# Patient Record
Sex: Female | Born: 1935 | Race: White | Hispanic: No | Smoking: Never smoker
Health system: Southern US, Community
[De-identification: ages and names within clinical notes are randomized; demographics above are authoritative.]

## PROBLEM LIST (undated history)

## (undated) DIAGNOSIS — E119 Type 2 diabetes mellitus without complications: Secondary | ICD-10-CM

## (undated) DIAGNOSIS — I639 Cerebral infarction, unspecified: Secondary | ICD-10-CM

## (undated) DIAGNOSIS — S72142B Displaced intertrochanteric fracture of left femur, initial encounter for open fracture type I or II: Secondary | ICD-10-CM

## (undated) DIAGNOSIS — F419 Anxiety disorder, unspecified: Secondary | ICD-10-CM

## (undated) HISTORY — PX: HIP SURGERY: SHX245

## (undated) HISTORY — DX: Cerebral infarction, unspecified: I63.9

## (undated) HISTORY — PX: ABDOMINAL HYSTERECTOMY: SHX81

---

## 2013-06-01 ENCOUNTER — Ambulatory Visit: Payer: Self-pay | Admitting: Ophthalmology

## 2013-08-10 ENCOUNTER — Ambulatory Visit: Payer: Self-pay | Admitting: Ophthalmology

## 2017-04-02 ENCOUNTER — Encounter: Payer: Self-pay | Admitting: Emergency Medicine

## 2017-04-02 ENCOUNTER — Emergency Department: Payer: Medicare Other

## 2017-04-02 ENCOUNTER — Emergency Department
Admission: EM | Admit: 2017-04-02 | Discharge: 2017-04-02 | Disposition: A | Discharge status: Skilled Nursing Facility | Payer: Medicare Other | Attending: Emergency Medicine | Admitting: Emergency Medicine

## 2017-04-02 DIAGNOSIS — I10 Essential (primary) hypertension: Secondary | ICD-10-CM | POA: Insufficient documentation

## 2017-04-02 DIAGNOSIS — E119 Type 2 diabetes mellitus without complications: Secondary | ICD-10-CM | POA: Diagnosis not present

## 2017-04-02 DIAGNOSIS — L03115 Cellulitis of right lower limb: Secondary | ICD-10-CM | POA: Insufficient documentation

## 2017-04-02 DIAGNOSIS — R2243 Localized swelling, mass and lump, lower limb, bilateral: Secondary | ICD-10-CM | POA: Diagnosis present

## 2017-04-02 HISTORY — DX: Anxiety disorder, unspecified: F41.9

## 2017-04-02 HISTORY — DX: Type 2 diabetes mellitus without complications: E11.9

## 2017-04-02 HISTORY — DX: Cerebral infarction, unspecified: I63.9

## 2017-04-02 HISTORY — DX: Displaced intertrochanteric fracture of left femur, initial encounter for open fracture type I or II: S72.142B

## 2017-04-02 LAB — BASIC METABOLIC PANEL
Anion gap: 10 (ref 5–15)
BUN: 18 mg/dL (ref 6–20)
CHLORIDE: 105 mmol/L (ref 101–111)
CO2: 25 mmol/L (ref 22–32)
CREATININE: 0.7 mg/dL (ref 0.44–1.00)
Calcium: 8.5 mg/dL — ABNORMAL LOW (ref 8.9–10.3)
GFR calc Af Amer: >60 mL/min (ref >60)
GFR calc non Af Amer: >60 mL/min (ref >60)
GLUCOSE: 138 mg/dL — AB (ref 65–99)
POTASSIUM: 3.7 mmol/L (ref 3.5–5.1)
Sodium: 140 mmol/L (ref 135–145)

## 2017-04-02 LAB — CBC WITH DIFFERENTIAL/PLATELET
Basophils Absolute: 0.2 10*3/uL — ABNORMAL HIGH (ref 0–0.1)
Basophils Relative: 4 %
EOS PCT: 6 %
Eosinophils Absolute: 0.4 10*3/uL (ref 0–0.7)
HCT: 31.1 % — ABNORMAL LOW (ref 35.0–47.0)
HEMOGLOBIN: 10.1 g/dL — AB (ref 12.0–16.0)
LYMPHS ABS: 1.1 10*3/uL (ref 1.0–3.6)
LYMPHS PCT: 16 %
MCH: 27.1 pg (ref 26.0–34.0)
MCHC: 32.4 g/dL (ref 32.0–36.0)
MCV: 83.5 fL (ref 80.0–100.0)
MONOS PCT: 7 %
Monocytes Absolute: 0.4 10*3/uL (ref 0.2–0.9)
Neutro Abs: 4.4 10*3/uL (ref 1.4–6.5)
Neutrophils Relative %: 67 %
PLATELETS: 317 10*3/uL (ref 150–440)
RBC: 3.72 MIL/uL — AB (ref 3.80–5.20)
RDW: 17 % — ABNORMAL HIGH (ref 11.5–14.5)
WBC: 6.6 10*3/uL (ref 3.6–11.0)

## 2017-04-02 LAB — LACTIC ACID, PLASMA: Lactic Acid, Venous: 0.8 mmol/L (ref 0.5–1.9)

## 2017-04-02 MED ORDER — CLINDAMYCIN PHOSPHATE 600 MG/50ML IV SOLN
600.0000 mg | Freq: Once | INTRAVENOUS | Status: AC
  Administered 2017-04-02: 600 mg via INTRAVENOUS
  Filled 2017-04-02: qty 50

## 2017-04-02 MED ORDER — CLINDAMYCIN HCL 300 MG PO CAPS: 300.0000 mg | ORAL_CAPSULE | Freq: Three times a day (TID) | ORAL | 0 refills | Status: AC

## 2017-04-02 NOTE — ED Notes (Signed)
Pt daughter signed paper copy of discharge instructions

## 2017-04-02 NOTE — ED Provider Notes (Signed)
[facility] Emergency Department Provider Note  ____________________________________________  Time seen: Approximately 3:14 PM  I have reviewed the triage vital signs and the nursing notes.   HISTORY  Chief Complaint Leg Swelling   HPI Candice Kelly is a 83 y.o. female with h/o recent CVA on ASA on 02/2017 and L hip replacement on 9/18, DM, HTN, HLD who presents for evaluation of bilateral leg swelling and cyanosis. Per daughter patient has had progressively worsening swelling of her legs for the last few days R>L. Daughter has also noticed that patient's toes will turn blue when patient stands for prolonged periods, the cyanosis resolves when the leg is elevated. Patient denies no leg pain. No personal or fh of DVT but patient has had several hospitalizations in the last few months. No CP or SOB, no fever, chills, nausea, or vomiting.   Past Medical History:  Diagnosis Date  . Anxiety   . Cerebral infarct (HCC)   . Diabetes mellitus without complication (HCC)   . Displaced intertroch fx left femur, init for opn fx type I/2 (HCC)     There are no active problems to display for this patient.   Past Surgical History:  Procedure Laterality Date  . ABDOMINAL HYSTERECTOMY    . HIP SURGERY      Prior to Admission medications   Medication Sig Start Date End Date Taking? Authorizing Provider  clindamycin (CLEOCIN) 300 MG capsule Take 1 capsule (300 mg total) by mouth 3 (three) times daily for 10 days. 04/02/17 04/12/17  Nita SickleVeronese, [place], MD    Allergies Penicillins  History reviewed. No pertinent family history.  Social History Social History   Tobacco Use  . Smoking status: Never Smoker  . Smokeless tobacco: Never Used  Substance Use Topics  . Alcohol use: No    Frequency: Never  . Drug use: No    Review of Systems  Constitutional: Negative for fever. Eyes: Negative for visual changes. ENT: Negative for sore throat. Neck: No neck pain    Cardiovascular: Negative for chest pain. Respiratory: Negative for shortness of breath. Gastrointestinal: Negative for abdominal pain, vomiting or diarrhea. Genitourinary: Negative for dysuria. Musculoskeletal: Negative for back pain. + b/l leg swelling and bluish discoloration Skin: Negative for rash. Neurological: Negative for headaches, weakness or numbness. Psych: No SI or HI  ____________________________________________   PHYSICAL EXAM:  VITAL SIGNS: ED Triage Vitals  Enc Vitals Group     BP 04/02/17 1500 (!) 166/65     Pulse Rate 04/02/17 1500 80     Resp 04/02/17 1500 16     Temp 04/02/17 1500 97.6 F (36.4 C)     Temp Source 04/02/17 1500 Oral     SpO2 04/02/17 1500 96 %     Weight 04/02/17 1438 208 lb 11.2 oz (94.7 kg)     Height 04/02/17 1438 5\' 2"  (1.575 m)     Head Circumference --      Peak Flow --      Pain Score --      Pain Loc --      Pain Edu? --      Excl. in GC? --     Constitutional: Alert and oriented. Well appearing and in no apparent distress. HEENT:      Head: Normocephalic and atraumatic.         Eyes: Conjunctivae are normal. Sclera is non-icteric.       Mouth/Throat: Mucous membranes are moist.       Neck:  Supple with no signs of meningismus. Cardiovascular: Regular rate and rhythm. No murmurs, gallops, or rubs. 2+ symmetrical distal pulses are present in all extremities. No JVD. Respiratory: Normal respiratory effort. Lungs are clear to auscultation bilaterally. No wheezes, crackles, or rhonchi.  Gastrointestinal: Soft, non tender, and non distended with positive bowel sounds. No rebound or guarding. Musculoskeletal: Pitting edema on b/l LE R>L, RLE is warm to touch and erythematous, LLE has no erythema and normal temperature. Palpable DP and PT pulses on the R, dopplerable DP and PT pulses on the L, no cyanosis.  Neurologic: Normal speech and language. Face is symmetric. Moving all extremities. No gross focal neurologic deficits are  appreciated. Skin: Skin is warm, dry and intact. No rash noted. Psychiatric: Mood and affect are normal. Speech and behavior are normal.  ____________________________________________   LABS (all labs ordered are listed, but only abnormal results are displayed)  Labs Reviewed  CBC WITH DIFFERENTIAL/PLATELET - Abnormal; Notable for the following components:      Result Value   RBC 3.72 (*)    Hemoglobin 10.1 (*)    HCT 31.1 (*)    RDW 17.0 (*)    Basophils Absolute 0.2 (*)    All other components within normal limits  BASIC METABOLIC PANEL - Abnormal; Notable for the following components:   Glucose, Bld 138 (*)    Calcium 8.5 (*)    All other components within normal limits  CULTURE, BLOOD (ROUTINE X 2)  CULTURE, BLOOD (ROUTINE X 2)  LACTIC ACID, PLASMA   ____________________________________________  EKG  none  ____________________________________________  RADIOLOGY  Doppler: negative for DVT  ____________________________________________   PROCEDURES  Procedure(s) performed: None Procedures Critical Care performed:  None ____________________________________________   INITIAL IMPRESSION / ASSESSMENT AND PLAN / ED COURSE  83 y.o. female with h/o recent CVA on ASA on 02/2017 and L hip replacement on 9/18, DM, HTN, HLD who presents for evaluation of bilateral leg swelling and cyanosis for the last few days. Patient has cellulitis on the RLE, no fever or tachycardia. Will check labs to rule out systemic infection or DKA. Will get doppler of b/l LE to eval for DVT due to several recent hospitalizations. Patient has no CP/ SOB. Patient most likely has component of PVD based on age and comorbidities however there is no evidence of ischemic foot at this time and the fact that cyanosis resolves with elevation of the limb and that patient has no fever, would argue against PVD as the possible etiology for the symptoms in the LLE. If no evidence of DVT, systemic infection, or DKA  plan for one dose of IV abx and dc home with referral to see vascular surgery as outpatient.     _________________________ 5:24 PM on 04/02/2017 -----------------------------------------  Both extremities remain well perfused with no evidence of ischemia. Doppler negative for DVT. Patient given one dose of IV clindamycin and will be dc back to rehab on clinda TID x 10 days. Discussed return precautions with patient and daughter for signs of worsening infection. Labs with normal WBC and no evidence of DKA. Patient remains pain free. Also recommended return to the ER if patient has pain or if cyanosis recurs otherwise referred patient to vascular surgery for close f/u as outpatient.    As part of my medical decision making, I reviewed the following data within the electronic MEDICAL RECORD NUMBER History obtained from family, Nursing notes reviewed and incorporated, Labs reviewed , Old chart reviewed, Radiograph reviewed , Notes from prior  ED visits and [place] Controlled Substance Database    Pertinent labs & imaging results that were available during my care of the patient were reviewed by me and considered in my medical decision making (see chart for details).    ____________________________________________   FINAL CLINICAL IMPRESSION(S) / ED DIAGNOSES  Final diagnoses:  Cellulitis of right lower extremity      NEW MEDICATIONS STARTED DURING THIS VISIT:  ED Discharge Orders        Ordered    clindamycin (CLEOCIN) 300 MG capsule  3 times daily     04/02/17 1721       Note:  This document was prepared using Dragon voice recognition software and may include unintentional dictation errors.    Nita SickleVeronese, [place], MD 04/02/17 [phone]

## 2017-04-02 NOTE — ED Notes (Signed)
MD Veronese at bedside  

## 2017-04-02 NOTE — ED Triage Notes (Signed)
Pt to ED by EMS from [facility] with c/o of bilateral LE swelling. Per the Pt's daughter the residents legs have been swelling and turning "blue". Pt suffered a stroke on Thanksgiving which has impaired her speech.

## 2017-04-02 NOTE — ED Notes (Signed)
[facility]  CALLED  TO  TRANSPORT  PT ?

## 2017-04-02 NOTE — ED Notes (Signed)
Secretary notified to call ems to transport [facility]

## 2017-04-02 NOTE — Discharge Instructions (Signed)

## 2017-04-07 LAB — CULTURE, BLOOD (ROUTINE X 2)
CULTURE: NO GROWTH
Culture: NO GROWTH
SPECIAL REQUESTS: ADEQUATE
Special Requests: ADEQUATE

## 2017-04-15 ENCOUNTER — Encounter (INDEPENDENT_AMBULATORY_CARE_PROVIDER_SITE_OTHER): Payer: Self-pay | Admitting: Vascular Surgery

## 2017-04-15 ENCOUNTER — Ambulatory Visit (INDEPENDENT_AMBULATORY_CARE_PROVIDER_SITE_OTHER): Payer: Medicare Other | Admitting: Vascular Surgery

## 2017-04-15 VITALS — BP 159/72 | HR 72 | Resp 14 | Ht 60.0 in | Wt 210.0 lb

## 2017-04-15 DIAGNOSIS — E119 Type 2 diabetes mellitus without complications: Secondary | ICD-10-CM | POA: Diagnosis not present

## 2017-04-15 DIAGNOSIS — I639 Cerebral infarction, unspecified: Secondary | ICD-10-CM

## 2017-04-15 DIAGNOSIS — M7989 Other specified soft tissue disorders: Secondary | ICD-10-CM | POA: Insufficient documentation

## 2017-04-15 NOTE — Assessment & Plan Note (Signed)
I have had a long discussion with the patient regarding swelling and why it  causes symptoms.  Patient will begin wearing graduated compression stockings class 1 (20-30 mmHg) on a daily basis a prescription was given. The patient will  beginning wearing the stockings first thing in the morning and removing them in the evening. The patient is instructed specifically not to sleep in the stockings.   In addition, behavioral modification will be initiated.  This will include frequent elevation, use of over the counter pain medications and exercise such as walking.  I have reviewed systemic causes for chronic edema such as liver, kidney and cardiac etiologies.  The patient denies problems with these organ systems.    Consideration for a lymph pump will also be made based upon the effectiveness of conservative therapy.  This would help to improve the edema control and prevent sequela such as ulcers and infections    The patient will follow-up with me in 3 months to assess how she is doing with compression and elevation.  We will try to avoid any invasive procedures on her if possible as long as her symptoms remain reasonably stable.

## 2017-04-15 NOTE — Assessment & Plan Note (Signed)
blood glucose control important in reducing the progression of atherosclerotic disease. Also, involved in wound healing. On appropriate medications.  

## 2017-04-15 NOTE — Patient Instructions (Signed)
Edema Edema is an abnormal buildup of fluids in your bodytissues. Edema is somewhatdependent on gravity to pull the fluid to the lowest place in your body. That makes the condition more common in the legs and thighs (lower extremities). Painless swelling of the feet and ankles is common and becomes more likely as you get older. It is also common in looser tissues, like around your eyes. When the affected area is squeezed, the fluid may move out of that spot and leave a dent for a few moments. This dent is called pitting. What are the causes? There are many possible causes of edema. Eating too much salt and being on your feet or sitting for a long time can cause edema in your legs and ankles. Hot weather may make edema worse. Common medical causes of edema include:  Heart failure.  Liver disease.  Kidney disease.  Weak blood vessels in your legs.  Cancer.  An injury.  Pregnancy.  Some medications.  Obesity.  What are the signs or symptoms? Edema is usually painless.Your skin may look swollen or shiny. How is this diagnosed? Your health care provider may be able to diagnose edema by asking about your medical history and doing a physical exam. You may need to have tests such as X-rays, an electrocardiogram, or blood tests to check for medical conditions that may cause edema. How is this treated? Edema treatment depends on the cause. If you have heart, liver, or kidney disease, you need the treatment appropriate for these conditions. General treatment may include:  Elevation of the affected body part above the level of your heart.  Compression of the affected body part. Pressure from elastic bandages or support stockings squeezes the tissues and forces fluid back into the blood vessels. This keeps fluid from entering the tissues.  Restriction of fluid and salt intake.  Use of a water pill (diuretic). These medications are appropriate only for some types of edema. They pull fluid  out of your body and make you urinate more often. This gets rid of fluid and reduces swelling, but diuretics can have side effects. Only use diuretics as directed by your health care provider.  Follow these instructions at home:  Keep the affected body part above the level of your heart when you are lying down.  Do not sit still or stand for prolonged periods.  Do not put anything directly under your knees when lying down.  Do not wear constricting clothing or garters on your upper legs.  Exercise your legs to work the fluid back into your blood vessels. This may help the swelling go down.  Wear elastic bandages or support stockings to reduce ankle swelling as directed by your health care provider.  Eat a low-salt diet to reduce fluid if your health care provider recommends it.  Only take medicines as directed by your health care provider. Contact a health care provider if:  Your edema is not responding to treatment.  You have heart, liver, or kidney disease and notice symptoms of edema.  You have edema in your legs that does not improve after elevating them.  You have sudden and unexplained weight gain. Get help right away if:  You develop shortness of breath or chest pain.  You cannot breathe when you lie down.  You develop pain, redness, or warmth in the swollen areas.  You have heart, liver, or kidney disease and suddenly get edema.  You have a fever and your symptoms suddenly get worse. This information is   not intended to replace advice given to you by your health care provider. Make sure you discuss any questions you have with your health care provider. Document Released: 04/15/2005 Document Revised: 09/21/2015 Document Reviewed: 02/05/2013 Elsevier Interactive Patient Education  2017 [employer]  

## 2017-04-15 NOTE — Assessment & Plan Note (Signed)
Mental status is poor and she is not a good historian.  Lifestyle modifications will be unreliable

## 2017-04-15 NOTE — Progress Notes (Signed)
Patient ID: Candice Kelly, female   DOB: [birth year], 83 y.o.   MRN: [id number]  Chief Complaint  Patient presents with  . New Patient (Initial Visit)    [facility] follow up one week    HPI Candice Kelly is a 83 y.o. female.  I am asked to see the patient by Dr. Alfred Levins in the [facility] ER for evaluation of recent lower extremity cellulitis and.  The patient provides little history and she is with a caretaker from her nursing facility today.  Her swelling seems reasonably mild today but apparently it was worse a couple of weeks ago.  She reports no pain today.  She denies any fever or chills.  She sits with her legs dependent much of the day in a wheelchair and really does not walk much.  No open wounds or ulcers at this point.  The cellulitis has improved with antibiotics.  There is no clear inciting event or causative factor that started the symptoms.  She had a negative DVT study at the hospital I have reviewed   Past Medical History:  Diagnosis Date  . Anxiety   . Cerebral infarct ([place])   . Diabetes mellitus without complication ([place])   . Displaced intertroch fx left femur, init for opn fx type I/2 ([place])   . Stroke (cerebrum) [facility])     Past Surgical History:  Procedure Laterality Date  . ABDOMINAL HYSTERECTOMY    . HIP SURGERY      Family History No bleeding disorders, clotting disorders, autoimmune diseases, or aneurysms  Social History Social History   Tobacco Use  . Smoking status: Never Smoker  . Smokeless tobacco: Never Used  Substance Use Topics  . Alcohol use: No    Frequency: Never  . Drug use: No  Lives in a facility  Allergies  Allergen Reactions  . Penicillins   . Potassium     Other reaction(s): Other (See Comments)  . Penicillin V Potassium Rash    As an adult    Current Outpatient Medications  Medication Sig Dispense Refill  . ALPRAZolam (XANAX) 0.25 MG tablet Take by mouth.    Marland Kitchen aspirin 81 MG chewable tablet Chew by mouth.    . calcium-vitamin D (OSCAL  WITH D) 500-200 MG-UNIT TABS tablet Take by mouth.    . ERGOCALCIFEROL PO Take by mouth.    Marland Kitchen glimepiride (AMARYL) 2 MG tablet TAKE 1 TABLET BY MOUTH ONCE DAILY    . Melatonin 3 MG TABS Take by mouth.    . nystatin (MYCOSTATIN/NYSTOP) powder Apply topically.    Marland Kitchen oxyCODONE (OXY IR/ROXICODONE) 5 MG immediate release tablet Take by mouth.    . pantoprazole (PROTONIX) 40 MG tablet Take by mouth.    . simvastatin (ZOCOR) 20 MG tablet Take by mouth.     No current facility-administered medications for this visit.       REVIEW OF SYSTEMS (Negative unless checked)  Constitutional: [] Weight loss  [] Fever  [] Chills Cardiac: [] Chest pain   [] Chest pressure   [] Palpitations   [] Shortness of breath when laying flat   [] Shortness of breath at rest   [] Shortness of breath with exertion. Vascular:  [] Pain in legs with walking   [] Pain in legs at rest   [] Pain in legs when laying flat   [] Claudication   [] Pain in feet when walking  [] Pain in feet at rest  [] Pain in feet when laying flat   [] History of DVT   [] Phlebitis   [x] Swelling in legs   []   Varicose veins   [] Non-healing ulcers Pulmonary:   [] Uses home oxygen   [] Productive cough   [] Hemoptysis   [] Wheeze  [] COPD   [] Asthma Neurologic:  [] Dizziness  [] Blackouts   [] Seizures   [x] History of stroke   [] History of TIA  [] Aphasia   [] Temporary blindness   [] Dysphagia   [] Weakness or numbness in arms   [] Weakness or numbness in legs Musculoskeletal:  [x] Arthritis   [] Joint swelling   [] Joint pain   [] Low back pain Hematologic:  [] Easy bruising  [] Easy bleeding   [] Hypercoagulable state   [] Anemic  [] Hepatitis Gastrointestinal:  [] Blood in stool   [] Vomiting blood  [] Gastroesophageal reflux/heartburn   [] Abdominal pain Genitourinary:  [] Chronic kidney disease   [] Difficult urination  [] Frequent urination  [] Burning with urination   [] Hematuria Skin:  [] Rashes   [] Ulcers   [] Wounds Psychological:  [] History of anxiety   []  History of major  depression.    Physical Exam BP (!) 159/72 (BP Location: Left Arm, Patient Position: Sitting)   Pulse 72   Resp 14   Ht 5' (1.524 m)   Wt 95.3 kg (210 lb)   BMI 41.01 kg/m  Gen:  WD/WN, NAD.  Appears younger than stated age Head: /AT, No temporalis wasting. Ear/Nose/Throat: Hearing grossly intact, nares w/o erythema or drainage, oropharynx w/o Erythema/Exudate Eyes: Conjunctiva clear, sclera non-icteric  Neck: trachea midline.  No JVD.  Pulmonary:  Good air movement, respirations not labored, no use of accessory muscles Cardiac: Irregular Vascular:  Vessel Right Left  Radial Palpable Palpable                          PT  not palpable  not palpable  DP  1+ palpable  1+ palpable   Gastrointestinal: soft, non-tender/non-distended.  Musculoskeletal: M/S 5/5 throughout.  Extremities without ischemic changes.  No deformity or atrophy.  1+ bilateral lower extremity edema. Neurologic: Sensation grossly intact in extremities.  Symmetrical.  Speech is limited. Motor exam as listed above. Psychiatric: Judgment and insight are poor.  Patient is a poor historian. Dermatologic: No rashes or ulcers noted.  No cellulitis or open wounds.  Mild stasis dermatitis changes are present in the lower leg    Radiology US Venous Img Lower Bilateral  Result Date: 04/02/2017 CLINICAL DATA:  Bilateral lower extremity swelling EXAM: BILATERAL LOWER EXTREMITY VENOUS DOPPLER ULTRASOUND TECHNIQUE: Gray-scale sonography with graded compression, as well as color Doppler and duplex ultrasound were performed to evaluate the lower extremity deep venous systems from the level of the common femoral vein and including the common femoral, femoral, profunda femoral, popliteal and calf veins including the posterior tibial, peroneal and gastrocnemius veins when visible. The superficial great saphenous vein was also interrogated. Spectral Doppler was utilized to evaluate flow at rest and with distal augmentation  maneuvers in the common femoral, femoral and popliteal veins. COMPARISON:  None. FINDINGS: RIGHT LOWER EXTREMITY Common Femoral Vein: No evidence of thrombus. Normal compressibility, respiratory phasicity and response to augmentation. Saphenofemoral Junction: No evidence of thrombus. Normal compressibility and flow on color Doppler imaging. Profunda Femoral Vein: No evidence of thrombus. Normal compressibility and flow on color Doppler imaging. Femoral Vein: No evidence of thrombus. Normal compressibility, respiratory phasicity and response to augmentation. Popliteal Vein: No evidence of thrombus. Normal compressibility, respiratory phasicity and response to augmentation. Calf Veins: No evidence of thrombus. Normal compressibility and flow on color Doppler imaging. Superficial Great Saphenous Vein: No evidence of thrombus. Normal compressibility. Venous Reflux:  None.  Other Findings:  None. LEFT LOWER EXTREMITY Common Femoral Vein: No evidence of thrombus. Normal compressibility, respiratory phasicity and response to augmentation. Saphenofemoral Junction: No evidence of thrombus. Normal compressibility and flow on color Doppler imaging. Profunda Femoral Vein: No evidence of thrombus. Normal compressibility and flow on color Doppler imaging. Femoral Vein: No evidence of thrombus. Normal compressibility, respiratory phasicity and response to augmentation. Popliteal Vein: No evidence of thrombus. Normal compressibility, respiratory phasicity and response to augmentation. Calf Veins: No evidence of thrombus. Normal compressibility and flow on color Doppler imaging. Superficial Great Saphenous Vein: No evidence of thrombus. Normal compressibility. Venous Reflux:  None. Other Findings:  None. IMPRESSION: No evidence of deep venous thrombosis. Electronically Signed   By: Jerilynn Mages.  Shick M.D.   On: 04/02/2017 15:59    Labs Recent Results (from the past 2160 hour(s))  CBC with Differential/Platelet     Status: Abnormal    Collection Time: 04/02/17  4:00 PM  Result Value Ref Range   WBC 6.6 3.6 - 11.0 K/uL   RBC 3.72 (L) 3.80 - 5.20 MIL/uL   Hemoglobin 10.1 (L) 12.0 - 16.0 g/dL   HCT 31.1 (L) 35.0 - 47.0 %   MCV 83.5 80.0 - 100.0 fL   MCH 27.1 26.0 - 34.0 pg   MCHC 32.4 32.0 - 36.0 g/dL   RDW 17.0 (H) 11.5 - 14.5 %   Platelets 317 150 - 440 K/uL   Neutrophils Relative % 67 %   Neutro Abs 4.4 1.4 - 6.5 K/uL   Lymphocytes Relative 16 %   Lymphs Abs 1.1 1.0 - 3.6 K/uL   Monocytes Relative 7 %   Monocytes Absolute 0.4 0.2 - 0.9 K/uL   Eosinophils Relative 6 %   Eosinophils Absolute 0.4 0 - 0.7 K/uL   Basophils Relative 4 %   Basophils Absolute 0.2 (H) 0 - 0.1 K/uL  Basic metabolic panel     Status: Abnormal   Collection Time: 04/02/17  4:00 PM  Result Value Ref Range   Sodium 140 135 - 145 mmol/L   Potassium 3.7 3.5 - 5.1 mmol/L   Chloride 105 101 - 111 mmol/L   CO2 25 22 - 32 mmol/L   Glucose, Bld 138 (H) 65 - 99 mg/dL   BUN 18 6 - 20 mg/dL   Creatinine, Ser 0.70 0.44 - 1.00 mg/dL   Calcium 8.5 (L) 8.9 - 10.3 mg/dL   GFR calc non Af Amer >60 >60 mL/min   GFR calc Af Amer >60 >60 mL/min    Comment: (NOTE) The eGFR has been calculated using the CKD EPI equation. This calculation has not been validated in all clinical situations. eGFR's persistently <60 mL/min signify possible Chronic Kidney Disease.    Anion gap 10 5 - 15  Lactic acid, plasma     Status: None   Collection Time: 04/02/17  4:00 PM  Result Value Ref Range   Lactic Acid, Venous 0.8 0.5 - 1.9 mmol/L  Blood culture (routine x 2)     Status: None   Collection Time: 04/02/17  4:00 PM  Result Value Ref Range   Specimen Description BLOOD BLOOD LEFT HAND    Special Requests      BOTTLES DRAWN AEROBIC AND ANAEROBIC Blood Culture adequate volume   Culture NO GROWTH 5 DAYS    Report Status 04/07/2017 FINAL   Blood culture (routine x 2)     Status: None   Collection Time: 04/02/17  4:00 PM  Result Value Ref Range  Specimen  Description BLOOD BLOOD RIGHT HAND    Special Requests      BOTTLES DRAWN AEROBIC AND ANAEROBIC Blood Culture adequate volume   Culture NO GROWTH 5 DAYS    Report Status 04/07/2017 FINAL     Assessment/Plan:  Stroke (cerebrum) ([place]) Mental status is poor and she is not a good historian.  Lifestyle modifications will be unreliable  Diabetes mellitus without complication (HCC) blood glucose control important in reducing the progression of atherosclerotic disease. Also, involved in wound healing. On appropriate medications.   Swelling of limb I have had a long discussion with the patient regarding swelling and why it  causes symptoms.  Patient will begin wearing graduated compression stockings class 1 (20-30 mmHg) on a daily basis a prescription was given. The patient will  beginning wearing the stockings first thing in the morning and removing them in the evening. The patient is instructed specifically not to sleep in the stockings.   In addition, behavioral modification will be initiated.  This will include frequent elevation, use of over the counter pain medications and exercise such as walking.  I have reviewed systemic causes for chronic edema such as liver, kidney and cardiac etiologies.  The patient denies problems with these organ systems.    Consideration for a lymph pump will also be made based upon the effectiveness of conservative therapy.  This would help to improve the edema control and prevent sequela such as ulcers and infections    The patient will follow-up with me in 3 months to assess how she is doing with compression and elevation.  We will try to avoid any invasive procedures on her if possible as long as her symptoms remain reasonably stable.       Leotis Pain 04/15/2017, 10:37 AM   This note was created with Dragon medical transcription system.  Any errors from dictation are unintentional.

## 2017-07-15 ENCOUNTER — Ambulatory Visit (INDEPENDENT_AMBULATORY_CARE_PROVIDER_SITE_OTHER): Payer: Medicare Other | Admitting: Vascular Surgery

## 2018-01-06 ENCOUNTER — Other Ambulatory Visit: Payer: Self-pay

## 2018-01-06 ENCOUNTER — Other Ambulatory Visit
Admission: RE | Admit: 2018-01-06 | Discharge: 2018-01-06 | Disposition: A | Discharge status: Home / Self Care | Payer: Medicare Other | Source: Ambulatory Visit | Attending: Family Medicine | Admitting: Family Medicine

## 2018-01-06 ENCOUNTER — Encounter: Payer: Self-pay | Admitting: Emergency Medicine

## 2018-01-06 ENCOUNTER — Emergency Department: Payer: Medicare Other

## 2018-01-06 ENCOUNTER — Inpatient Hospital Stay
Admission: EM | Admit: 2018-01-06 | Discharge: 2018-01-08 | DRG: 293 | Payer: Medicare Other | Attending: Internal Medicine | Admitting: Internal Medicine

## 2018-01-06 DIAGNOSIS — I2510 Atherosclerotic heart disease of native coronary artery without angina pectoris: Secondary | ICD-10-CM | POA: Diagnosis present

## 2018-01-06 DIAGNOSIS — Z7901 Long term (current) use of anticoagulants: Secondary | ICD-10-CM | POA: Diagnosis not present

## 2018-01-06 DIAGNOSIS — L89309 Pressure ulcer of unspecified buttock, unspecified stage: Secondary | ICD-10-CM | POA: Diagnosis present

## 2018-01-06 DIAGNOSIS — I110 Hypertensive heart disease with heart failure: Secondary | ICD-10-CM | POA: Insufficient documentation

## 2018-01-06 DIAGNOSIS — R0602 Shortness of breath: Secondary | ICD-10-CM

## 2018-01-06 DIAGNOSIS — I5023 Acute on chronic systolic (congestive) heart failure: Secondary | ICD-10-CM | POA: Diagnosis present

## 2018-01-06 DIAGNOSIS — Z794 Long term (current) use of insulin: Secondary | ICD-10-CM

## 2018-01-06 DIAGNOSIS — E782 Mixed hyperlipidemia: Secondary | ICD-10-CM | POA: Diagnosis present

## 2018-01-06 DIAGNOSIS — Z888 Allergy status to other drugs, medicaments and biological substances status: Secondary | ICD-10-CM | POA: Diagnosis not present

## 2018-01-06 DIAGNOSIS — J189 Pneumonia, unspecified organism: Secondary | ICD-10-CM | POA: Insufficient documentation

## 2018-01-06 DIAGNOSIS — R4182 Altered mental status, unspecified: Secondary | ICD-10-CM | POA: Diagnosis present

## 2018-01-06 DIAGNOSIS — Z880 Allergy status to penicillin: Secondary | ICD-10-CM | POA: Diagnosis not present

## 2018-01-06 DIAGNOSIS — L89159 Pressure ulcer of sacral region, unspecified stage: Secondary | ICD-10-CM | POA: Diagnosis present

## 2018-01-06 DIAGNOSIS — Z90710 Acquired absence of both cervix and uterus: Secondary | ICD-10-CM

## 2018-01-06 DIAGNOSIS — I69320 Aphasia following cerebral infarction: Secondary | ICD-10-CM | POA: Diagnosis not present

## 2018-01-06 DIAGNOSIS — F0390 Unspecified dementia without behavioral disturbance: Secondary | ICD-10-CM | POA: Diagnosis present

## 2018-01-06 DIAGNOSIS — Z96642 Presence of left artificial hip joint: Secondary | ICD-10-CM | POA: Diagnosis present

## 2018-01-06 DIAGNOSIS — R0902 Hypoxemia: Secondary | ICD-10-CM | POA: Diagnosis present

## 2018-01-06 DIAGNOSIS — L8990 Pressure ulcer of unspecified site, unspecified stage: Secondary | ICD-10-CM

## 2018-01-06 DIAGNOSIS — F419 Anxiety disorder, unspecified: Secondary | ICD-10-CM | POA: Diagnosis present

## 2018-01-06 DIAGNOSIS — E119 Type 2 diabetes mellitus without complications: Secondary | ICD-10-CM | POA: Diagnosis present

## 2018-01-06 DIAGNOSIS — I509 Heart failure, unspecified: Secondary | ICD-10-CM | POA: Insufficient documentation

## 2018-01-06 DIAGNOSIS — I480 Paroxysmal atrial fibrillation: Secondary | ICD-10-CM | POA: Diagnosis present

## 2018-01-06 LAB — CBC WITH DIFFERENTIAL/PLATELET
BASOS ABS: 0 10*3/uL (ref 0–0.1)
BASOS PCT: 0 %
Basophils Absolute: 0 10*3/uL (ref 0–0.1)
Basophils Relative: 1 %
EOS PCT: 7 %
Eosinophils Absolute: 0.7 10*3/uL (ref 0–0.7)
Eosinophils Absolute: 0.7 10*3/uL (ref 0–0.7)
Eosinophils Relative: 7 %
HCT: 27.5 % — ABNORMAL LOW (ref 35.0–47.0)
HEMATOCRIT: 28.7 % — AB (ref 35.0–47.0)
Hemoglobin: 9 g/dL — ABNORMAL LOW (ref 12.0–16.0)
Hemoglobin: 8.9 g/dL — ABNORMAL LOW (ref 12.0–16.0)
LYMPHS ABS: 1.1 10*3/uL (ref 1.0–3.6)
LYMPHS ABS: 1.2 10*3/uL (ref 1.0–3.6)
LYMPHS PCT: 11 %
Lymphocytes Relative: 11 %
MCH: 25.3 pg — AB (ref 26.0–34.0)
MCH: 25.9 pg — ABNORMAL LOW (ref 26.0–34.0)
MCHC: 31.6 g/dL — ABNORMAL LOW (ref 32.0–36.0)
MCHC: 32.4 g/dL (ref 32.0–36.0)
MCV: 80.2 fL (ref 80.0–100.0)
MCV: 79.9 fL — ABNORMAL LOW (ref 80.0–100.0)
MONO ABS: 0.9 10*3/uL (ref 0.2–0.9)
MONOS PCT: 9 %
Monocytes Absolute: 1 10*3/uL — ABNORMAL HIGH (ref 0.2–0.9)
Monocytes Relative: 9 %
NEUTROS ABS: 7.1 10*3/uL — AB (ref 1.4–6.5)
Neutro Abs: 7.6 10*3/uL — ABNORMAL HIGH (ref 1.4–6.5)
Neutrophils Relative %: 72 %
Neutrophils Relative %: 73 %
PLATELETS: 380 10*3/uL (ref 150–440)
Platelets: 380 10*3/uL (ref 150–440)
RBC: 3.57 MIL/uL — AB (ref 3.80–5.20)
RBC: 3.44 MIL/uL — ABNORMAL LOW (ref 3.80–5.20)
RDW: 27.1 % — ABNORMAL HIGH (ref 11.5–14.5)
RDW: 27.4 % — AB (ref 11.5–14.5)
WBC: 9.9 10*3/uL (ref 3.6–11.0)
WBC: 10.6 10*3/uL (ref 3.6–11.0)

## 2018-01-06 LAB — COMPREHENSIVE METABOLIC PANEL
ALK PHOS: 69 U/L (ref 38–126)
ALT: 14 U/L (ref 0–44)
ALT: 14 U/L (ref 0–44)
ANION GAP: 8 (ref 5–15)
AST: 23 U/L (ref 15–41)
AST: 27 U/L (ref 15–41)
Albumin: 2.6 g/dL — ABNORMAL LOW (ref 3.5–5.0)
Albumin: 2.8 g/dL — ABNORMAL LOW (ref 3.5–5.0)
Alkaline Phosphatase: 67 U/L (ref 38–126)
Anion gap: 7 (ref 5–15)
BUN: 20 mg/dL (ref 8–23)
BUN: 19 mg/dL (ref 8–23)
CALCIUM: 8.3 mg/dL — AB (ref 8.9–10.3)
CALCIUM: 8.4 mg/dL — AB (ref 8.9–10.3)
CHLORIDE: 97 mmol/L — AB (ref 98–111)
CO2: 31 mmol/L (ref 22–32)
CO2: 31 mmol/L (ref 22–32)
CREATININE: 0.74 mg/dL (ref 0.44–1.00)
Chloride: 97 mmol/L — ABNORMAL LOW (ref 98–111)
Creatinine, Ser: 0.81 mg/dL (ref 0.44–1.00)
GFR calc non Af Amer: >60 mL/min (ref >60)
Glucose, Bld: 97 mg/dL (ref 70–99)
Glucose, Bld: 112 mg/dL — ABNORMAL HIGH (ref 70–99)
POTASSIUM: 3.9 mmol/L (ref 3.5–5.1)
Potassium: 4.2 mmol/L (ref 3.5–5.1)
SODIUM: 135 mmol/L (ref 135–145)
Sodium: 136 mmol/L (ref 135–145)
Total Bilirubin: 0.6 mg/dL (ref 0.3–1.2)
Total Bilirubin: 0.8 mg/dL (ref 0.3–1.2)
Total Protein: 6 g/dL — ABNORMAL LOW (ref 6.5–8.1)
Total Protein: 6.6 g/dL (ref 6.5–8.1)

## 2018-01-06 LAB — GLUCOSE, CAPILLARY: Glucose-Capillary: 117 mg/dL — ABNORMAL HIGH (ref 70–99)

## 2018-01-06 LAB — BRAIN NATRIURETIC PEPTIDE: B NATRIURETIC PEPTIDE 5: 343 pg/mL — AB (ref 0.0–100.0)

## 2018-01-06 LAB — TROPONIN I: Troponin I: <0.03 ng/mL (ref <0.03)

## 2018-01-06 MED ORDER — CALCIUM CARBONATE ANTACID 500 MG PO CHEW
1500.0000 mg | CHEWABLE_TABLET | Freq: Every day | ORAL | Status: DC
  Filled 2018-01-06: qty 8

## 2018-01-06 MED ORDER — PANTOPRAZOLE SODIUM 40 MG PO TBEC
40.0000 mg | DELAYED_RELEASE_TABLET | Freq: Every day | ORAL | Status: DC
  Administered 2018-01-06 – 2018-01-08 (×3): 40 mg via ORAL
  Filled 2018-01-06 (×3): qty 1

## 2018-01-06 MED ORDER — IPRATROPIUM-ALBUTEROL 0.5-2.5 (3) MG/3ML IN SOLN
3.0000 mL | Freq: Once | RESPIRATORY_TRACT | Status: AC
Start: 2018-01-06 — End: 2018-01-06
  Administered 2018-01-06: 3 mL via RESPIRATORY_TRACT
  Filled 2018-01-06: qty 3

## 2018-01-06 MED ORDER — OXYCODONE HCL 5 MG PO TABS
2.5000 mg | ORAL_TABLET | Freq: Four times a day (QID) | ORAL | Status: DC | PRN
  Administered 2018-01-08: 2.5 mg via ORAL
  Filled 2018-01-06: qty 1

## 2018-01-06 MED ORDER — NYSTATIN 100000 UNIT/GM EX POWD
1.0000 g | Freq: Three times a day (TID) | CUTANEOUS | Status: DC
  Administered 2018-01-06 – 2018-01-08 (×5): 1 g via TOPICAL
  Filled 2018-01-06 (×2): qty 15

## 2018-01-06 MED ORDER — APIXABAN 5 MG PO TABS
5.0000 mg | ORAL_TABLET | Freq: Two times a day (BID) | ORAL | Status: DC
  Administered 2018-01-06 – 2018-01-08 (×4): 5 mg via ORAL
  Filled 2018-01-06 (×4): qty 1

## 2018-01-06 MED ORDER — FUROSEMIDE 10 MG/ML IJ SOLN
60.0000 mg | Freq: Once | INTRAMUSCULAR | Status: AC
Start: 2018-01-06 — End: 2018-01-06
  Administered 2018-01-06: 60 mg via INTRAVENOUS
  Filled 2018-01-06: qty 8

## 2018-01-06 MED ORDER — SODIUM CHLORIDE 0.9% FLUSH
3.0000 mL | Freq: Two times a day (BID) | INTRAVENOUS | Status: DC
  Administered 2018-01-06 – 2018-01-08 (×4): 3 mL via INTRAVENOUS

## 2018-01-06 MED ORDER — INSULIN ASPART 100 UNIT/ML ~~LOC~~ SOLN: 0.0000 [IU] | Freq: Every day | SUBCUTANEOUS | Status: DC

## 2018-01-06 MED ORDER — ALPRAZOLAM 0.5 MG PO TABS: 0.2500 mg | ORAL_TABLET | Freq: Three times a day (TID) | ORAL | Status: DC | PRN

## 2018-01-06 MED ORDER — IPRATROPIUM-ALBUTEROL 0.5-2.5 (3) MG/3ML IN SOLN
3.0000 mL | Freq: Once | RESPIRATORY_TRACT | Status: AC
  Administered 2018-01-06: 3 mL via RESPIRATORY_TRACT
  Filled 2018-01-06: qty 3

## 2018-01-06 MED ORDER — ADULT MULTIVITAMIN W/MINERALS CH
1.0000 | ORAL_TABLET | Freq: Every day | ORAL | Status: DC
  Administered 2018-01-07 – 2018-01-08 (×2): 1 via ORAL
  Filled 2018-01-06 (×2): qty 1

## 2018-01-06 MED ORDER — PAROXETINE HCL 10 MG PO TABS
10.0000 mg | ORAL_TABLET | Freq: Every day | ORAL | Status: DC
  Administered 2018-01-07 – 2018-01-08 (×2): 10 mg via ORAL
  Filled 2018-01-06 (×2): qty 1

## 2018-01-06 MED ORDER — FUROSEMIDE 10 MG/ML IJ SOLN
40.0000 mg | Freq: Two times a day (BID) | INTRAMUSCULAR | Status: DC
  Administered 2018-01-07 – 2018-01-08 (×3): 40 mg via INTRAVENOUS
  Filled 2018-01-06 (×3): qty 4

## 2018-01-06 MED ORDER — RAMIPRIL 2.5 MG PO CAPS
2.5000 mg | ORAL_CAPSULE | Freq: Every day | ORAL | Status: DC
  Administered 2018-01-06 – 2018-01-08 (×3): 2.5 mg via ORAL
  Filled 2018-01-06 (×3): qty 1

## 2018-01-06 MED ORDER — SODIUM CHLORIDE 0.9 % IV SOLN: 250.0000 mL | INTRAVENOUS | Status: DC | PRN

## 2018-01-06 MED ORDER — ONDANSETRON HCL 4 MG/2ML IJ SOLN: 4.0000 mg | Freq: Four times a day (QID) | INTRAMUSCULAR | Status: DC | PRN

## 2018-01-06 MED ORDER — SIMVASTATIN 20 MG PO TABS
20.0000 mg | ORAL_TABLET | Freq: Every day | ORAL | Status: DC
  Administered 2018-01-06 – 2018-01-07 (×2): 20 mg via ORAL
  Filled 2018-01-06 (×2): qty 1

## 2018-01-06 MED ORDER — SODIUM CHLORIDE 0.9% FLUSH: 3.0000 mL | INTRAVENOUS | Status: DC | PRN

## 2018-01-06 MED ORDER — INSULIN ASPART 100 UNIT/ML ~~LOC~~ SOLN
0.0000 [IU] | Freq: Three times a day (TID) | SUBCUTANEOUS | Status: DC
  Administered 2018-01-07: 2 [IU] via SUBCUTANEOUS
  Administered 2018-01-08: 1 [IU] via SUBCUTANEOUS
  Filled 2018-01-06 (×2): qty 1

## 2018-01-06 MED ORDER — ACETAMINOPHEN 325 MG PO TABS: 650.0000 mg | ORAL_TABLET | ORAL | Status: DC | PRN

## 2018-01-06 NOTE — ED Triage Notes (Signed)
Pt ems from [facility] for SOB[facility] and was due to be discharged today. O2 sats here 81% RA with audible wheezes. 98% 3 LNC. Pt confused and poor historian

## 2018-01-06 NOTE — ED Notes (Signed)
Yellow socks and bracelet applied to pt.

## 2018-01-06 NOTE — ED Notes (Signed)
Pt found to be crying out, "Help me, help me."  Pt unable to tell me what is wrong, appears very anxious.  Vitals WDL, no acute distress noted.  Family reports hx of anxiety, used to be on Xanax.  Hospitalist made aware; see new orders.

## 2018-01-06 NOTE — ED Provider Notes (Signed)
[facility] Emergency Department Provider Note  Time seen: 4:08 PM  I have reviewed the triage vital signs and the nursing notes.   HISTORY  Chief Complaint Shortness of Breath    HPI Candice Kelly is a 84 y.o. female with a past medical history of anxiety, CVA, diabetes, presents to the emergency department from [facility] for wheeze and apparent difficulty breathing.  According to triage note patient is currently at [facility] following a fall for rehabilitation.  Per EMS patient was noted today have audible wheezes found to be 81% on room air with no O2 requirement at baseline.  Upon arrival patient is satting in the upper 90s on 3 L.  She does appear confused is not able to tell me where she is or the date, it is not clear if the patient has a history of dementia at this time versus acute altered mental status.   Past Medical History:  Diagnosis Date  . Anxiety   . Cerebral infarct (HCC)   . Diabetes mellitus without complication (HCC)   . Displaced intertroch fx left femur, init for opn fx type I/2 (HCC)   . Stroke (cerebrum) [facility])     Patient Active Problem List   Diagnosis Date Noted  . Stroke (cerebrum) (HCC) 04/15/2017  . Diabetes mellitus without complication (HCC) 04/15/2017  . Swelling of limb 04/15/2017    Past Surgical History:  Procedure Laterality Date  . ABDOMINAL HYSTERECTOMY    . HIP SURGERY      Prior to Admission medications   Medication Sig Start Date End Date Taking? Authorizing Provider  ALPRAZolam Prudy Feeler) 0.25 MG tablet Take by mouth. 01/02/17   [provider]  aspirin 81 MG chewable tablet Chew by mouth. 03/26/17 03/26/18  [provider]  calcium-vitamin D (OSCAL WITH D) 500-200 MG-UNIT TABS tablet Take by mouth. 03/13/17   [provider]  ERGOCALCIFEROL PO Take by mouth.    [provider]  glimepiride (AMARYL) 2 MG tablet TAKE 1 TABLET BY MOUTH ONCE DAILY 12/09/16   [provider]  Melatonin 3 MG TABS Take by mouth. 03/25/17   [provider]  nystatin (MYCOSTATIN/NYSTOP) powder Apply topically. 03/25/17 03/25/18  [provider]  oxyCODONE (OXY IR/ROXICODONE) 5 MG immediate release tablet Take by mouth.    [provider]  pantoprazole (PROTONIX) 40 MG tablet Take by mouth. 03/26/17 03/26/18  [provider]  simvastatin (ZOCOR) 20 MG tablet Take by mouth. 03/25/17 03/25/18  [provider]    Allergies  Allergen Reactions  . Penicillins   . Potassium     Other reaction(s): Other (See Comments)  . Penicillin V Potassium Rash    As an adult    No family history on file.  Social History Social History   Tobacco Use  . Smoking status: Never Smoker  . Smokeless tobacco: Never Used  Substance Use Topics  . Alcohol use: No    Frequency: Never  . Drug use: No    Review of Systems Constitutional: Negative for fever. Cardiovascular: Negative for chest pain. Respiratory: Negative for shortness of breath. Gastrointestinal: Negative for abdominal pain, vomiting Genitourinary: Negative for urinary compaints Musculoskeletal: Negative for musculoskeletal complaints Skin: Negative for skin complaints  Neurological: Negative for headache All other ROS negative  ____________________________________________   PHYSICAL EXAM:  Constitutional: Patient is awake and alert, she is disoriented not able to tell me where she is or the date.  No distress. Eyes: Normal exam ENT  Head: Normocephalic and atraumatic   Mouth/Throat: Mucous membranes are moist. Cardiovascular: Normal rate, regular rhythm.  Respiratory: Normal respiratory effort without tachypnea nor retractions. Diffuse expiratory wheeze.  Gastrointestinal: Soft and nontender. No distention.   Musculoskeletal: Nontender with normal range of motion in all extremities.  Mild lower extremity edema equal bilaterally mild erythema bilaterally.   Left lower extremity was wrapped with gauze. Neurologic: Normal speech and language.  Patient able to move all extremities. Skin:  Skin is warm, dry and intact.  Psychiatric: Mood and affect are normal.   ____________________________________________    EKG  EKG reviewed and interpreted by myself shows what appears to be atrial fibrillation versus sinus arrhythmia at 90 bpm with a narrow QRS, normal axis, largely normal intervals with nonspecific ST changes.  ____________________________________________    RADIOLOGY  Chest x-ray shows interstitial edema  ____________________________________________   INITIAL IMPRESSION / ASSESSMENT AND PLAN / ED COURSE  Pertinent labs & imaging results that were available during my care of the patient were reviewed by me and considered in my medical decision making (see chart for details).  Patient presents to the emergency department for wheeze found to be hypoxic to 81% placed on oxygen.  Patient does have fairly diffuse wheeze in all lung fields.  We will treat with duo nebs will obtain labs, chest x-ray and continue to closely monitor.  She is satting in the mid upper 90s on 3 L via nasal cannula.  Patient's labs are resulted showing a negative troponin, H&H largely unchanged from recent lab work.  Chest x-ray does show vascular congestion and interstitial edema.  Minimal improvement after DuoNeb's.  We will start the patient on Lasix and admit to the hospitalist service.  ____________________________________________   FINAL CLINICAL IMPRESSION(S) / ED DIAGNOSES  Dyspnea Hypoxia CHF exacerbation.   Minna Antis, MD 01/06/18 1800

## 2018-01-06 NOTE — ED Notes (Signed)
Report given to Ashley S. RN.

## 2018-01-06 NOTE — H&P (Signed)
[facility] - [place] at [facility]   PATIENT NAME: Candice Kelly    MR#:  [id number]  DATE OF BIRTH:  04-28-1926  DATE OF ADMISSION:  01/06/2018  PRIMARY CARE PHYSICIAN: Patient, No Pcp Per   REQUESTING/REFERRING PHYSICIAN:   CHIEF COMPLAINT:   Chief Complaint  Patient presents with  . Shortness of Breath    HISTORY OF PRESENT ILLNESS: Candice Kelly  is a 84 y.o. female with a known history of diabetes mellitus type 2, hypertension, hyperlipidemia, left hip replacement, CVA is a resident of [facility].  Patient was referred for shortness of breath.  Has difficulty breathing and wheezing and her O2 saturation was around 81% and she presented to the emergency room.  Patient not on any oxygen at baseline.  She was put on oxygen by nasal cannula to 3 L and her saturations were improved.  She had some swelling in the lower extremities.  She was worked up with chest x-ray which showed bilateral congestion and fluid overload.  Patient is not a great historian does not give much history.  PAST MEDICAL HISTORY:   Past Medical History:  Diagnosis Date  . Anxiety   . Cerebral infarct (HCC)   . Diabetes mellitus without complication (HCC)   . Displaced intertroch fx left femur, init for opn fx type I/2 (HCC)   . Stroke (cerebrum) (HCC)     PAST SURGICAL HISTORY:  Past Surgical History:  Procedure Laterality Date  . ABDOMINAL HYSTERECTOMY    . HIP SURGERY      SOCIAL HISTORY:  Social History   Tobacco Use  . Smoking status: Never Smoker  . Smokeless tobacco: Never Used  Substance Use Topics  . Alcohol use: No    Frequency: Never    FAMILY HISTORY: History reviewed. No pertinent family history.  DRUG ALLERGIES:  Allergies  Allergen Reactions  . Potassium     Other reaction(s): Other (See Comments)  . Penicillin V Potassium Rash    As an adult  . Penicillins Rash    Has patient had a PCN reaction causing immediate rash, facial/tongue/throat  swelling, SOB or lightheadedness with hypotension: Yes Has patient had a PCN reaction causing severe rash involving mucus membranes or skin necrosis: No Has patient had a PCN reaction that required hospitalization: No Has patient had a PCN reaction occurring within the last 10 years: No If all of the above answers are "NO", then may proceed with Cephalosporin use.    REVIEW OF SYSTEMS:  Unable to obtain completely as patient is confused MEDICATIONS AT HOME:  Prior to Admission medications   Medication Sig Start Date End Date Taking? Authorizing Provider  apixaban (ELIQUIS) 5 MG TABS tablet Take 5 mg by mouth 2 (two) times daily.   Yes [provider]  calcium carbonate (OSCAL) 1500 (600 Ca) MG TABS tablet Take 1,500 mg by mouth daily with breakfast.   Yes [provider]  Multiple Vitamins-Minerals (MULTIVITAMIN WITH MINERALS) tablet Take 1 tablet by mouth daily.   Yes [provider]  nystatin (MYCOSTATIN/NYSTOP) powder Apply 1 g topically 3 (three) times daily.  03/25/17 03/25/18 Yes [provider]  pantoprazole (PROTONIX) 40 MG tablet Take 40 mg by mouth daily.  03/26/17 03/26/18 Yes [provider]  PARoxetine (PAXIL) 10 MG tablet Take 10 mg by mouth daily.   Yes [provider]  simvastatin (ZOCOR) 20 MG tablet Take 20 mg by mouth at bedtime.  03/25/17 03/25/18 Yes [provider]  oxyCODONE (OXY IR/ROXICODONE) 5 MG immediate release tablet Take 2.5 mg by mouth every 6 (six) hours as needed for moderate pain.     [provider]      PHYSICAL EXAMINATION:   VITAL SIGNS: Blood pressure (!) 131/44, pulse 95, temperature 98.9 F (37.2 C), temperature source Oral, resp. rate 20, weight 90.7 kg, SpO2 95 %.  GENERAL:  84 y.o.-year-old patient lying in the bed oxygen via nasal cannula.  EYES: Pupils equal, round, reactive to light and accommodation. No scleral icterus. Extraocular muscles intact.  HEENT: Head  atraumatic, normocephalic. Oropharynx and nasopharynx clear.  NECK:  Supple, no jugular venous distention. No thyroid enlargement, no tenderness.  LUNGS: Decreased breath sounds bilaterally, bibasilar crepitations heard. No use of accessory muscles of respiration.  CARDIOVASCULAR: S1, S2 normal. No murmurs, rubs, or gallops.  ABDOMEN: Soft, nontender, nondistended. Bowel sounds present. No organomegaly or mass.  EXTREMITIES: No cyanosis or clubbing Has pedal edema Left leg Ace wrap bandage noted NEUROLOGIC: Cranial nerves II through XII are intact. Muscle strength 5/5 in all extremities. Sensation intact. Gait not checked.  PSYCHIATRIC: The patient is alert and oriented x 1 SKIN: No obvious rash, lesion, or ulcer.   LABORATORY PANEL:   CBC Recent Labs  Lab 01/06/18 1140 01/06/18 1608  WBC 9.9 10.6  HGB 9.0* 8.9*  HCT 28.7* 27.5*  PLT 380 380  MCV 80.2 79.9*  MCH 25.3* 25.9*  MCHC 31.6* 32.4  RDW 27.1* 27.4*  LYMPHSABS 1.1 1.2  MONOABS 0.9 1.0*  EOSABS 0.7 0.7  BASOSABS 0.0 0.0   ------------------------------------------------------------------------------------------------------------------  Chemistries  Recent Labs  Lab 01/06/18 1140 01/06/18 1608  NA 135 136  K 4.2 3.9  CL 97* 97*  CO2 31 31  GLUCOSE 97 112*  BUN 20 19  CREATININE 0.74 0.81  CALCIUM 8.3* 8.4*  AST 23 27  ALT 14 14  ALKPHOS 69 67  BILITOT 0.6 0.8   ------------------------------------------------------------------------------------------------------------------ CrCl cannot be calculated (Unknown ideal weight.). ------------------------------------------------------------------------------------------------------------------ No results for input(s): TSH, T4TOTAL, T3FREE, THYROIDAB in the last 72 hours.  Invalid input(s): FREET3   Coagulation profile No results for input(s): INR, PROTIME in the last 168  hours. ------------------------------------------------------------------------------------------------------------------- No results for input(s): DDIMER in the last 72 hours. -------------------------------------------------------------------------------------------------------------------  Cardiac Enzymes Recent Labs  Lab 01/06/18 1608  TROPONINI <0.03   ------------------------------------------------------------------------------------------------------------------ Invalid input(s): POCBNP  ---------------------------------------------------------------------------------------------------------------  Urinalysis No results found for: COLORURINE, APPEARANCEUR, LABSPEC, PHURINE, GLUCOSEU, HGBUR, BILIRUBINUR, KETONESUR, PROTEINUR, UROBILINOGEN, NITRITE, LEUKOCYTESUR   RADIOLOGY: Dg Chest Portable 1 View  Result Date: 01/06/2018 CLINICAL DATA:  Shortness of breath EXAM: PORTABLE CHEST 1 VIEW COMPARISON:  None. FINDINGS: Cardiomegaly with vascular congestion and mild pulmonary edema. Probable small pleural effusions. Airspace disease at the left base. Aortic atherosclerosis. No pneumothorax. IMPRESSION: 1. Cardiomegaly with vascular congestion and mild interstitial edema. 2. Probable small pleural effusions with left basilar atelectasis or infiltrate Electronically Signed   By: Jasmine Pang M.D.   On: 01/06/2018 16:25    EKG: Orders placed or performed in visit on 01/06/18  . EKG 12-Lead    IMPRESSION AND PLAN:  84 year old female patient with history of CVA, diabetes mellitus type 2, hypertension, left hip replacement presented to the eminence room for shortness of breath  -Acute congestive heart failure Diurese patient with IV Lasix Check echocardiogram Monitor electrolytes Input output chart and body weights  -History of CVA Resume anticoagulation with Eliquis Continue statin medication  -Type 2 diabetes mellitus Diabetic diet with sliding scale coverage with  insulin  -  Altered mental status Could be from dementia versus encephalopathy  -DVT prophylaxis on anticoagulation with Eliquis  All the records are reviewed and case discussed with ED provider. Management plans discussed with the patient, family and they are in agreement.  CODE STATUS:Full code Advance Directive Documentation     Most Recent Value  Type of Advance Directive  Out of facility DNR (pink MOST or yellow form)  Pre-existing out of facility DNR order (yellow form or pink MOST form)  Pink MOST form placed in chart (order not valid for inpatient use)  "MOST" Form in Place?  -       TOTAL TIME TAKING CARE OF THIS PATIENT: 54 minutes.    Ihor Austin M.D on 01/06/2018 at 6:55 PM  Between 7am to 6pm - Pager - [phone]  After 6pm go to [email or web address] - password [email or web address]  [facility]  Office  [phone]  CC: Primary care physician; Patient, No Pcp Per

## 2018-01-06 NOTE — ED Notes (Signed)
Pt saturated sheets/bed with urine.  Pt cleaned and Pure Wick applied.  Pt tolerated well.

## 2018-01-07 ENCOUNTER — Inpatient Hospital Stay
Admit: 2018-01-07 | Discharge: 2018-01-07 | Disposition: A | Discharge status: Home / Self Care | Payer: Medicare Other | Attending: Internal Medicine | Admitting: Internal Medicine

## 2018-01-07 DIAGNOSIS — L8990 Pressure ulcer of unspecified site, unspecified stage: Secondary | ICD-10-CM

## 2018-01-07 LAB — ECHOCARDIOGRAM COMPLETE
Height: 62 in
Weight: 3084.8 oz

## 2018-01-07 LAB — GLUCOSE, CAPILLARY
GLUCOSE-CAPILLARY: 101 mg/dL — AB (ref 70–99)
Glucose-Capillary: 154 mg/dL — ABNORMAL HIGH (ref 70–99)

## 2018-01-07 LAB — TROPONIN I
Troponin I: <0.03 ng/mL (ref <0.03)
Troponin I: <0.03 ng/mL (ref <0.03)

## 2018-01-07 LAB — BASIC METABOLIC PANEL
Anion gap: 6 (ref 5–15)
BUN: 19 mg/dL (ref 8–23)
CALCIUM: 8.2 mg/dL — AB (ref 8.9–10.3)
CO2: 35 mmol/L — AB (ref 22–32)
CREATININE: 0.84 mg/dL (ref 0.44–1.00)
Chloride: 98 mmol/L (ref 98–111)
GFR calc Af Amer: >60 mL/min (ref >60)
GFR, EST NON AFRICAN AMERICAN: 59 mL/min — AB (ref >60)
Glucose, Bld: 140 mg/dL — ABNORMAL HIGH (ref 70–99)
Potassium: 3.1 mmol/L — ABNORMAL LOW (ref 3.5–5.1)
Sodium: 139 mmol/L (ref 135–145)

## 2018-01-07 LAB — MAGNESIUM: Magnesium: 2.2 mg/dL (ref 1.7–2.4)

## 2018-01-07 LAB — MRSA PCR SCREENING: MRSA BY PCR: NEGATIVE

## 2018-01-07 MED ORDER — POTASSIUM CHLORIDE CRYS ER 20 MEQ PO TBCR
40.0000 meq | EXTENDED_RELEASE_TABLET | ORAL | Status: AC
  Administered 2018-01-07 (×2): 40 meq via ORAL
  Filled 2018-01-07 (×2): qty 2

## 2018-01-07 MED ORDER — CALCIUM CARBONATE ANTACID 500 MG PO CHEW
600.0000 mg | CHEWABLE_TABLET | Freq: Every day | ORAL | Status: DC
  Administered 2018-01-07 – 2018-01-08 (×2): 600 mg via ORAL
  Filled 2018-01-07 (×2): qty 3

## 2018-01-07 MED ORDER — ORAL CARE MOUTH RINSE
15.0000 mL | Freq: Two times a day (BID) | OROMUCOSAL | Status: DC
  Administered 2018-01-07 – 2018-01-08 (×3): 15 mL via OROMUCOSAL

## 2018-01-07 NOTE — Clinical Social Work Note (Signed)
CSW received consult that patient is from [facility] [place] short term rehab.  CSW to facilitate discharge planning.  Ervin Knack. Rasheed Welty, MSW, Theresia Majors [phone]  01/07/2018 10:21 AM

## 2018-01-07 NOTE — Plan of Care (Signed)

## 2018-01-07 NOTE — Consult Note (Signed)
[facility] Card[facility] Consultation Note  Patient ID: Candice Kelly, MRN: [id number], DOB/AGE: [birth year] 84 y.o. Admit date: 01/06/2018   Date of Consult: 01/07/2018 Primary Physician: Myrene Buddy, NP Primary Cardiologist: [facility]  Chief Complaint:  Chief Complaint  Patient presents with  . Shortness of Breath   Reason for Consult: Acute on chronic heart failure  HPI: 84 y.o. female with expressive aphasia after previous cerebrovascular accident paroxysmal nonvalvular atrial fibrillation essential hypertension mixed hyperlipidemia coronary artery disease diabetes with complication her for which the patient is on appropriate medication management.  She has been on anticoagulation for above as well as furosemide for congestive heart failure and simvastatin for high intensity cholesterol therapy and ramipril for hypertension control.  She has had progressive episode of shortness of breath weakness and fatigue and unable to breathe well.  When seen in the emergency room she had some hypoxia with a chest x-ray showing pleural effusion and pulmonary edema.  Additionally she has EKG showing normal sinus rhythm with pre-atrial contractions and a normal troponin without evidence of myocardial infarction.  She has had now intravenous Lasix that has helped her hypoxia and effusion and is breathing much better.  With her expressive aphasia it is very difficult to further distinguish symptoms  Past Medical History:  Diagnosis Date  . Anxiety   . Cerebral infarct (HCC)   . Diabetes mellitus without complication (HCC)   . Displaced intertroch fx left femur, init for opn fx type I/2 (HCC)   . Stroke (cerebrum) [facility])       Surgical History:  Past Surgical History:  Procedure Laterality Date  . ABDOMINAL HYSTERECTOMY    . HIP SURGERY       Home Meds: Prior to Admission medications   Medication Sig Start Date End Date Taking? Authorizing Provider  apixaban (ELIQUIS) 5 MG TABS tablet Take 5  mg by mouth 2 (two) times daily.   Yes [provider]  calcium carbonate (OSCAL) 1500 (600 Ca) MG TABS tablet Take 1,500 mg by mouth daily with breakfast.   Yes [provider]  Multiple Vitamins-Minerals (MULTIVITAMIN WITH MINERALS) tablet Take 1 tablet by mouth daily.   Yes [provider]  nystatin (MYCOSTATIN/NYSTOP) powder Apply 1 g topically 3 (three) times daily.  03/25/17 03/25/18 Yes [provider]  pantoprazole (PROTONIX) 40 MG tablet Take 40 mg by mouth daily.  03/26/17 03/26/18 Yes [provider]  PARoxetine (PAXIL) 10 MG tablet Take 10 mg by mouth daily.   Yes [provider]  simvastatin (ZOCOR) 20 MG tablet Take 20 mg by mouth at bedtime.  03/25/17 03/25/18 Yes [provider]  oxyCODONE (OXY IR/ROXICODONE) 5 MG immediate release tablet Take 2.5 mg by mouth every 6 (six) hours as needed for moderate pain.     [provider]    Inpatient Medications:  . apixaban  5 mg Oral BID  . calcium carbonate  600 mg of elemental calcium Oral Q breakfast  . furosemide  40 mg Intravenous Q12H  . insulin aspart  0-5 Units Subcutaneous QHS  . insulin aspart  0-9 Units Subcutaneous TID WC  . mouth rinse  15 mL Mouth Rinse BID  . multivitamin with minerals  1 tablet Oral Daily  . nystatin  1 g Topical TID  . pantoprazole  40 mg Oral Daily  . PARoxetine  10 mg Oral Daily  . ramipril  2.5 mg Oral Daily  . simvastatin  20 mg Oral QHS  . sodium chloride  flush  3 mL Intravenous Q12H   . sodium chloride      Allergies:  Allergies  Allergen Reactions  . Potassium     Other reaction(s): Other (See Comments)  . Penicillin V Potassium Rash    As an adult  . Penicillins Rash    Has patient had a PCN reaction causing immediate rash, facial/tongue/throat swelling, SOB or lightheadedness with hypotension: Yes Has patient had a PCN reaction causing severe rash involving mucus membranes or skin necrosis: No Has patient had  a PCN reaction that required hospitalization: No Has patient had a PCN reaction occurring within the last 10 years: No If all of the above answers are "NO", then may proceed with Cephalosporin use.    Social History   Socioeconomic History  . Marital status: Widowed    Spouse name: Not on file  . Number of children: Not on file  . Years of education: Not on file  . Highest education level: Not on file  Occupational History  . Occupation: retired  Engineer, production  . Financial resource strain: Not on file  . Food insecurity:    Worry: Not on file    Inability: Not on file  . Transportation needs:    Medical: Not on file    Non-medical: Not on file  Tobacco Use  . Smoking status: Never Smoker  . Smokeless tobacco: Never Used  Substance and Sexual Activity  . Alcohol use: No    Frequency: Never  . Drug use: No  . Sexual activity: Not on file  Lifestyle  . Physical activity:    Days per week: Not on file    Minutes per session: Not on file  . Stress: Not on file  Relationships  . Social connections:    Talks on phone: Not on file    Gets together: Not on file    Attends religious service: Not on file    Active member of club or organization: Not on file    Attends meetings of clubs or organizations: Not on file    Relationship status: Not on file  . Intimate partner violence:    Fear of current or ex partner: Not on file    Emotionally abused: Not on file    Physically abused: Not on file    Forced sexual activity: Not on file  Other Topics Concern  . Not on file  Social History Narrative  . Not on file     History reviewed. No pertinent family history.   Review of Systems Unable to appropriately assess review of systems due to expressive aphasia  labs: Recent Labs    01/06/18 1608 01/06/18 2246 01/07/18 0252 01/07/18 0858  TROPONINI <0.03 <0.03 <0.03 <0.03   Lab Results  Component Value Date   WBC 10.6 01/06/2018   HGB 8.9 (L) 01/06/2018   HCT 27.5 (L)  01/06/2018   MCV 79.9 (L) 01/06/2018   PLT 380 01/06/2018    Recent Labs  Lab 01/06/18 1608 01/07/18 0252  NA 136 139  K 3.9 3.1*  CL 97* 98  CO2 31 35*  BUN 19 19  CREATININE 0.81 0.84  CALCIUM 8.4* 8.2*  PROT 6.6  --   BILITOT 0.8  --   ALKPHOS 67  --   ALT 14  --   AST 27  --   GLUCOSE 112* 140*   No results found for: CHOL, HDL, LDLCALC, TRIG No results found for: DDIMER  Radiology/Studies:  Dg Chest Portable 1 View  Result Date: 01/06/2018 CLINICAL DATA:  Shortness of breath EXAM: PORTABLE CHEST 1 VIEW COMPARISON:  None. FINDINGS: Cardiomegaly with vascular congestion and mild pulmonary edema. Probable small pleural effusions. Airspace disease at the left base. Aortic atherosclerosis. No pneumothorax. IMPRESSION: 1. Cardiomegaly with vascular congestion and mild interstitial edema. 2. Probable small pleural effusions with left basilar atelectasis or infiltrate Electronically Signed   By: Jasmine Pang M.D.   On: 01/06/2018 16:25    EKG: Normal sinus rhythm with pre-atrial contractions  Weights: Filed Weights   01/06/18 1612 01/06/18 2104 01/07/18 0405  Weight: 90.7 kg 88 kg 87.5 kg     Physical Exam: Blood pressure (!) 111/47, pulse 73, temperature 98.3 F (36.8 C), temperature source Oral, resp. rate 16, height 5\' 2"  (1.575 m), weight 87.5 kg, SpO2 96 %. Body mass index is 35.26 kg/m. General: Well developed, well nourished, in no acute distress. Head eyes ears nose throat: Normocephalic, atraumatic, sclera non-icteric, no xanthomas, nares are without discharge. No apparent thyromegaly and/or mass  Lungs: Normal respiratory effort.  no wheezes, basilar rales, no rhonchi.  Heart: Irregular with normal S1 S2. no murmur gallop, no rub, PMI is normal size and placement, carotid upstroke normal without bruit, jugular venous pressure is normal Abdomen: Soft, non-tender, non-distended with normoactive bowel sounds. No hepatomegaly. No rebound/guarding. No obvious  abdominal masses. Abdominal aorta is normal size without bruit Extremities: 1+ edema. no cyanosis, no clubbing, no ulcers  Peripheral : 2+ bilateral upper extremity pulses, 2+ bilateral femoral pulses, 2+ bilateral dorsal pedal pulse Neuro: Not alert and oriented. No facial asymmetry. No focal deficit. Moves all extremities spontaneously. Musculoskeletal: Normal muscle tone without kyphosis Psych: Does not responds to questions appropriately with a normal affect.    Assessment: -year-old female with known paroxysmal nonvalvular atrial fibrillation essential hypertension may diabetes with complication expressive aphasia from previous stroke with acute on chronic systolic dysfunction congestive heart failure without evidence of myocardial infarction  Plan: 1.  Continue intravenous Lasix Lasix for pulmonary edema lower extremity edema and acute on chronic systolic dysfunction congestive heart failure 2.  Continue anticoagulation for further risk reduction of stroke with expressive aphasia and atrial fibrillation 3.  Inhibitor for hypertension control 4.  High intensity cholesterol therapy 5.  Supportive care of expressive aphasia and stroke 6.  No further cardiac diagnostics necessary at this time 7.  Begin ambulation and follow for need for adjustments of medication and  Signed, Lamar Blinks M.D. [facility] Card[facility] 01/07/2018, 6:57 PM

## 2018-01-07 NOTE — Care Management (Signed)
Patient admitted from [facility] for shortness of breath. Oxygen sats were 81%. Patient is not fully able to participate verbally due to aphasia from previus stroke.  Oxygen is acute.CSW is checking to determine level of functioning prior to this admission as it is reported that patient was to discharge from the facility today.  Granddaughter is to come to the unit.  It has been reported to CM that she had wanted patient to go to [facility] rather than [facility] as patient's cardiology is there. At present, there does not appear to be medical necessity for transfer

## 2018-01-07 NOTE — Progress Notes (Signed)
[facility] - [place] at [facility]   PATIENT NAME: Candice Kelly    MR#:  [id number]  DATE OF BIRTH:  [birth year]  SUBJECTIVE:  CHIEF COMPLAINT: Patient shortness of breath is slightly better.  REVIEW OF SYSTEMS:  CONSTITUTIONAL: No fever, fatigue or weakness.  EYES: No blurred or double vision.  EARS, NOSE, AND THROAT: No tinnitus or ear pain.  RESPIRATORY: No cough, improving shortness of breath, denies wheezing or hemoptysis.  CARDIOVASCULAR: No chest pain, orthopnea, edema.  GASTROINTESTINAL: No nausea, vomiting, diarrhea or abdominal pain.  GENITOURINARY: No dysuria, hematuria.  ENDOCRINE: No polyuria, nocturia,  HEMATOLOGY: No anemia, easy bruising or bleeding SKIN: No rash or lesion. MUSCULOSKELETAL: No joint pain or arthritis.   NEUROLOGIC: No tingling, numbness, weakness.  PSYCHIATRY: No anxiety or depression.   DRUG ALLERGIES:   Allergies  Allergen Reactions  . Potassium     Other reaction(s): Other (See Comments)  . Penicillin V Potassium Rash    As an adult  . Penicillins Rash    Has patient had a PCN reaction causing immediate rash, facial/tongue/throat swelling, SOB or lightheadedness with hypotension: Yes Has patient had a PCN reaction causing severe rash involving mucus membranes or skin necrosis: No Has patient had a PCN reaction that required hospitalization: No Has patient had a PCN reaction occurring within the last 10 years: No If all of the above answers are "NO", then may proceed with Cephalosporin use.    VITALS:  Blood pressure (!) 119/56, pulse 93, temperature 98 F (36.7 C), temperature source Oral, resp. rate 16, height 5\' 2"  (1.575 m), weight 87.5 kg, SpO2 95 %.  PHYSICAL EXAMINATION:  GENERAL:  84 y.o.-year-old patient lying in the bed with no acute distress.  EYES: Pupils equal, round, reactive to light and accommodation. No scleral icterus. Extraocular muscles intact.  HEENT: Head atraumatic, normocephalic.  Oropharynx and nasopharynx clear.  NECK:  Supple, no jugular venous distention. No thyroid enlargement, no tenderness.  LUNGS: Moderate breath sounds bilaterally, minimal rales and rhonchi, no wheezing. No use of accessory muscles of respiration.  CARDIOVASCULAR: S1, S2 normal. No murmurs, rubs, or gallops.  ABDOMEN: Soft, nontender, nondistended. Bowel sounds present. No organomegaly or mass.  EXTREMITIES: No pedal edema, cyanosis, or clubbing.  NEUROLOGIC: Cranial nerves II through XII are intact. Muscle strength 5/5 in all extremities. Sensation intact. Gait not checked.  PSYCHIATRIC: The patient is alert and oriented x 3.  SKIN: No obvious rash, lesion, or ulcer.    LABORATORY PANEL:   CBC Recent Labs  Lab 01/06/18 1608  WBC 10.6  HGB 8.9*  HCT 27.5*  PLT 380   ------------------------------------------------------------------------------------------------------------------  Chemistries  Recent Labs  Lab 01/06/18 1608 01/07/18 0252 01/07/18 0858  NA 136 139  --   K 3.9 3.1*  --   CL 97* 98  --   CO2 31 35*  --   GLUCOSE 112* 140*  --   BUN 19 19  --   CREATININE 0.81 0.84  --   CALCIUM 8.4* 8.2*  --   MG  --   --  2.2  AST 27  --   --   ALT 14  --   --   ALKPHOS 67  --   --   BILITOT 0.8  --   --    ------------------------------------------------------------------------------------------------------------------  Cardiac Enzymes Recent Labs  Lab 01/07/18 0858  TROPONINI <0.03   ------------------------------------------------------------------------------------------------------------------  RADIOLOGY:  Dg Chest Portable 1 View  Result Date: 01/06/2018 CLINICAL DATA:  Shortness of breath EXAM: PORTABLE CHEST 1 VIEW COMPARISON:  None. FINDINGS: Cardiomegaly with vascular congestion and mild pulmonary edema. Probable small pleural effusions. Airspace disease at the left base. Aortic atherosclerosis. No pneumothorax. IMPRESSION: 1. Cardiomegaly with vascular  congestion and mild interstitial edema. 2. Probable small pleural effusions with left basilar atelectasis or infiltrate Electronically Signed   By: Jasmine Pang M.D.   On: 01/06/2018 16:25    EKG:   Orders placed or performed in visit on 01/06/18  . EKG 12-Lead  . EKG 12-Lead  . EKG 12-Lead    ASSESSMENT AND PLAN:    84 year old female patient with history of CVA, diabetes mellitus type 2, hypertension, left hip replacement presented to the eminence room for shortness of breath  -Acute congestive heart failure Diurese patient with IV Lasix Check echocardiogram Monitor electrolytes Input output chart and body weights -Primary cardiologist at [facility].  On-call cardiology Kovalski consulted Patient's granddaughter Candice Kelly is requesting the patient to be transferred to [facility] as all her primary care physician other doctors and cardiologist at [facility].  Called [facility] transfer center and initiated the transfer process awaiting callback  -History of CVA Resume anticoagulation with Eliquis Continue statin medication  -Type 2 diabetes mellitus Diabetic diet with sliding scale coverage with insulin  -Altered mental status with baseline dementia Clinically better   -DVT prophylaxis on anticoagulation with Eliquis    All the records are reviewed and case discussed with Care Management/Social Workerr. Management plans discussed with the patient, patient's granddaughter Insurance risk surveyor healthcare power of attorney and they are in agreement.  CODE STATUS:  FC   TOTAL TIME TAKING CARE OF THIS PATIENT: 36  minutes.   POSSIBLE D/C IN 1-2 DAYS, DEPENDING ON CLINICAL CONDITION.  Note: This dictation was prepared with Dragon dictation along with smaller phrase technology. Any transcriptional errors that result from this process are unintentional.   Ramonita Lab M.D on 01/07/2018 at 4:16 PM  Between 7am to 6pm - Pager - [phone] After 6pm go to [email or web address] - password  [email or web address] [facility]  [place] [facility]  Office  [phone]  CC: Primary care physician; Myrene Buddy, NP

## 2018-01-07 NOTE — Progress Notes (Signed)
*  PRELIMINARY RESULTS* Echocardiogram 2D Echocardiogram has been performed.  Joanette Gula Shiara Mcgough 01/07/2018, 3:00 PM

## 2018-01-07 NOTE — Care Management (Signed)
Spoke to patient's granddaughter Candace.  She confirmed her desire for patient to be transferred to [facility].  CM explained medical necessity and if this is not met, transfer would not be covered by insurance. Candace verbalizes understanding but says if the transfer is accepted and bed is available she wants patient to transfer.  Candace is HCPOA as patient's daughter was killed  in [place] recently. If patient does not transfer and needs to go back to skilled nursing, this would be acceptable.  A referral was made to [facility] in anticipation  of discharge from the skilled nursing facility.  CM notified [facility] of admission

## 2018-01-07 NOTE — Plan of Care (Signed)
  Problem: Nutrition: Goal: Adequate nutrition will be maintained Outcome: Progressing   Problem: Coping: Goal: Level of anxiety will decrease Outcome: Progressing   Problem: Elimination: Goal: Will not experience complications related to urinary retention Outcome: Progressing   Problem: Pain Managment: Goal: General experience of comfort will improve Outcome: Progressing Note: No complaints of pain this shift   Problem: Safety: Goal: Ability to remain free from injury will improve Outcome: Progressing   Problem: Skin Integrity: Goal: Risk for impaired skin integrity will decrease Outcome: Progressing   

## 2018-01-08 LAB — GLUCOSE, CAPILLARY
GLUCOSE-CAPILLARY: 120 mg/dL — AB (ref 70–99)
GLUCOSE-CAPILLARY: 140 mg/dL — AB (ref 70–99)
Glucose-Capillary: 116 mg/dL — ABNORMAL HIGH (ref 70–99)
Glucose-Capillary: 100 mg/dL — ABNORMAL HIGH (ref 70–99)

## 2018-01-08 LAB — BASIC METABOLIC PANEL
ANION GAP: 8 (ref 5–15)
BUN: 15 mg/dL (ref 8–23)
CALCIUM: 8.4 mg/dL — AB (ref 8.9–10.3)
CO2: 36 mmol/L — ABNORMAL HIGH (ref 22–32)
Chloride: 96 mmol/L — ABNORMAL LOW (ref 98–111)
Creatinine, Ser: 0.89 mg/dL (ref 0.44–1.00)
GFR calc Af Amer: >60 mL/min (ref >60)
GFR, EST NON AFRICAN AMERICAN: 55 mL/min — AB (ref >60)
Glucose, Bld: 115 mg/dL — ABNORMAL HIGH (ref 70–99)
Potassium: 3.6 mmol/L (ref 3.5–5.1)
SODIUM: 140 mmol/L (ref 135–145)

## 2018-01-08 MED ORDER — INSULIN ASPART 100 UNIT/ML ~~LOC~~ SOLN: 0.0000 [IU] | Freq: Three times a day (TID) | SUBCUTANEOUS | 11 refills | Status: AC

## 2018-01-08 MED ORDER — FUROSEMIDE 10 MG/ML IJ SOLN: 40.0000 mg | Freq: Two times a day (BID) | INTRAMUSCULAR | 0 refills | Status: AC

## 2018-01-08 MED ORDER — ATORVASTATIN CALCIUM 40 MG PO TABS: 40.0000 mg | ORAL_TABLET | Freq: Every day | ORAL | Status: AC

## 2018-01-08 MED ORDER — CALCIUM CARBONATE ANTACID 500 MG PO CHEW: 600.0000 mg | CHEWABLE_TABLET | Freq: Every day | ORAL | Status: AC

## 2018-01-08 MED ORDER — INSULIN ASPART 100 UNIT/ML ~~LOC~~ SOLN: 0.0000 [IU] | Freq: Every day | SUBCUTANEOUS | 11 refills | Status: AC

## 2018-01-08 MED ORDER — ACETAMINOPHEN 325 MG PO TABS: 650.0000 mg | ORAL_TABLET | ORAL | Status: AC | PRN

## 2018-01-08 MED ORDER — RAMIPRIL 2.5 MG PO CAPS: 2.5000 mg | ORAL_CAPSULE | Freq: Every day | ORAL | Status: AC

## 2018-01-08 MED ORDER — ALPRAZOLAM 0.25 MG PO TABS: 0.2500 mg | ORAL_TABLET | Freq: Three times a day (TID) | ORAL | 0 refills | Status: AC | PRN

## 2018-01-08 MED ORDER — ATORVASTATIN CALCIUM 20 MG PO TABS: 40.0000 mg | ORAL_TABLET | Freq: Every day | ORAL | Status: DC

## 2018-01-08 MED ORDER — ONDANSETRON HCL 4 MG/2ML IJ SOLN: 4.0000 mg | Freq: Four times a day (QID) | INTRAMUSCULAR | 0 refills | Status: AC | PRN

## 2018-01-08 MED ORDER — ADULT MULTIVITAMIN W/MINERALS CH: 1.0000 | ORAL_TABLET | Freq: Every day | ORAL | Status: AC

## 2018-01-08 MED ORDER — NYSTATIN 100000 UNIT/GM EX POWD: 1.0000 g | Freq: Three times a day (TID) | CUTANEOUS | 0 refills | Status: AC

## 2018-01-08 NOTE — Discharge Summary (Signed)
[facility] - [place] at [facility]   PATIENT NAME: Candice Kelly    MR#:  [id number]  DATE OF BIRTH:  [birth year]  DATE OF ADMISSION:  01/06/2018 ADMITTING PHYSICIAN: Ihor Austin, MD  DATE OF DISCHARGE:  01/08/18  PRIMARY CARE PHYSICIAN: Myrene Buddy, NP    ADMISSION DIAGNOSIS:  Shortness of breath [R06.02] Congestive heart failure, unspecified HF chronicity, unspecified heart failure type (HCC) [I50.9]  DISCHARGE DIAGNOSIS:  Active Problems:   CHF (congestive heart failure) (HCC)   Pressure injury of skin   SECONDARY DIAGNOSIS:   Past Medical History:  Diagnosis Date  . Anxiety   . Cerebral infarct (HCC)   . Diabetes mellitus without complication (HCC)   . Displaced intertroch fx left femur, init for opn fx type I/2 (HCC)   . Stroke (cerebrum) [facility])     HOSPITAL COURSE:  HISTORY OF PRESENT ILLNESS: Candice Kelly  is a 84 y.o. female with a known history of diabetes mellitus type 2, hypertension, hyperlipidemia, left hip replacement, CVA [facility]  Patient was referred for shortness of breath.  Has difficulty breathing and wheezing and her O2 saturation was around 81% and she presented to the emergency room.  Patient not on any oxygen at baseline.  She was put on oxygen by nasal cannula to 3 L and her saturations were improved.  She had some swelling in the lower extremities.  She was worked up with chest x-ray which showed bilateral congestion and fluid overload.  Patient is not a great historian does not give much history  Acute congestive heart failure Diurese patient with IV Lasix echocardiogram with 55 to 60% ejection fraction Monitor electrolytes Input output chart and body weights -Primary cardiologist at [facility].  On-call cardiology Kovalski consulted Patient's granddaughter Serena Colonel is requesting the patient to be transferred to [facility] as all her primary care physician other doctors and  cardiologist at [facility].  Called [facility] transfer center and patient is accepted will transfer the patient to [facility] bed number [id number] to Dr. Maury Dus, cardiology service  -History of CVA Resume anticoagulation with Eliquis Continue statin medication  -Type 2 diabetes mellitus Diabetic diet with sliding scale coverage with insulin  -Altered mental status with baseline dementia Clinically better   -DVT prophylaxis on anticoagulation with Eliquis  Physical therapy is recommending skilled nursing facility  DISCHARGE CONDITIONS:   fair  CONSULTS OBTAINED:  Treatment Team:  Lamar Blinks, MD   PROCEDURES  None   DRUG ALLERGIES:   Allergies  Allergen Reactions  . Potassium     Other reaction(s): Other (See Comments)  . Penicillin V Potassium Rash    As an adult  . Penicillins Rash    Has patient had a PCN reaction causing immediate rash, facial/tongue/throat swelling, SOB or lightheadedness with hypotension: Yes Has patient had a PCN reaction causing severe rash involving mucus membranes or skin necrosis: No Has patient had a PCN reaction that required hospitalization: No Has patient had a PCN reaction occurring within the last 10 years: No If all of the above answers are "NO", then may proceed with Cephalosporin use.    DISCHARGE MEDICATIONS:   Allergies as of 01/08/2018      Reactions   Potassium    Other reaction(s): Other (See Comments)   Penicillin V Potassium Rash   As an adult   Penicillins Rash   Has patient had a PCN reaction causing immediate rash, facial/tongue/throat swelling, SOB or lightheadedness  with hypotension: Yes Has patient had a PCN reaction causing severe rash involving mucus membranes or skin necrosis: No Has patient had a PCN reaction that required hospitalization: No Has patient had a PCN reaction occurring within the last 10 years: No If all of the above answers are "NO", then may proceed with Cephalosporin use.       Medication List    STOP taking these medications   calcium carbonate 1500 (600 Ca) MG Tabs tablet Commonly known as:  OSCAL Replaced by:  calcium carbonate 500 MG chewable tablet   PARoxetine 10 MG tablet Commonly known as:  PAXIL   simvastatin 20 MG tablet Commonly known as:  ZOCOR     TAKE these medications   acetaminophen 325 MG tablet Commonly known as:  TYLENOL Take 2 tablets (650 mg total) by mouth every 4 (four) hours as needed for headache or mild pain.   ALPRAZolam 0.25 MG tablet Commonly known as:  XANAX Take 1 tablet (0.25 mg total) by mouth 3 (three) times daily as needed for anxiety.   atorvastatin 40 MG tablet Commonly known as:  LIPITOR Take 1 tablet (40 mg total) by mouth daily at 6 PM.   calcium carbonate 500 MG chewable tablet Commonly known as:  TUMS - dosed in mg elemental calcium Chew 3 tablets (600 mg of elemental calcium total) by mouth daily with breakfast. Start taking on:  01/09/2018 Replaces:  calcium carbonate 1500 (600 Ca) MG Tabs tablet   ELIQUIS 5 MG Tabs tablet Generic drug:  apixaban Take 5 mg by mouth 2 (two) times daily.   furosemide 10 MG/ML injection Commonly known as:  LASIX Inject 4 mLs (40 mg total) into the vein every 12 (twelve) hours.   insulin aspart 100 UNIT/ML injection Commonly known as:  novoLOG Inject 0-9 Units into the skin 3 (three) times daily with meals.   insulin aspart 100 UNIT/ML injection Commonly known as:  novoLOG Inject 0-5 Units into the skin at bedtime.   multivitamin with minerals Tabs tablet Take 1 tablet by mouth daily. Start taking on:  01/09/2018   nystatin powder Commonly known as:  MYCOSTATIN/NYSTOP Apply 1 g topically 3 (three) times daily.   ondansetron 4 MG/2ML Soln injection Commonly known as:  ZOFRAN Inject 2 mLs (4 mg total) into the vein every 6 (six) hours as needed for nausea.   oxyCODONE 5 MG immediate release tablet Commonly known as:  Oxy IR/ROXICODONE Take 2.5 mg by mouth  every 6 (six) hours as needed for moderate pain.   pantoprazole 40 MG tablet Commonly known as:  PROTONIX Take 40 mg by mouth daily.   ramipril 2.5 MG capsule Commonly known as:  ALTACE Take 1 capsule (2.5 mg total) by mouth daily. Start taking on:  01/09/2018        DISCHARGE INSTRUCTIONS:   Transfer to [facility]  DIET:  Cardiac diet and Diabetic diet  DISCHARGE CONDITION:  Fair  ACTIVITY:  Activity as tolerated  OXYGEN:  Home Oxygen: Yes.     Oxygen Delivery: 1 liters/min via Patient connected to nasal cannula oxygen  DISCHARGE LOCATION:  [facility]  If you experience worsening of your admission symptoms, develop shortness of breath, life threatening emergency, suicidal or homicidal thoughts you must seek medical attention immediately by calling 911 or calling your MD immediately  if symptoms less severe.  You Must read complete instructions/literature along with all the possible adverse reactions/side effects for all the Medicines you take and that have  been prescribed to you. Take any new Medicines after you have completely understood and accpet all the possible adverse reactions/side effects.   Please note  You were cared for by a hospitalist during your hospital stay. If you have any questions about your discharge medications or the care you received while you were in the hospital after you are discharged, you can call the unit and asked to speak with the hospitalist on call if the hospitalist that took care of you is not available. Once you are discharged, your primary care physician will handle any further medical issues. Please note that NO REFILLS for any discharge medications will be authorized once you are discharged, as it is imperative that you return to your primary care physician (or establish a relationship with a primary care physician if you do not have one) for your aftercare needs so that they can reassess your need for  medications and monitor your lab values.     Today  Chief Complaint  Patient presents with  . Shortness of Breath   Patient reports that her shortness of breath is much better.  Feeling fine no other complaints  ROS:  CONSTITUTIONAL: Denies fevers, chills. Denies any fatigue, weakness.  EYES: Denies blurry vision, double vision, eye pain. EARS, NOSE, THROAT: Denies tinnitus, ear pain, hearing loss. RESPIRATORY: Denies cough, wheeze, shortness of breath.  CARDIOVASCULAR: Denies chest pain, palpitations, edema.  GASTROINTESTINAL: Denies nausea, vomiting, diarrhea, abdominal pain. Denies bright red blood per rectum. GENITOURINARY: Denies dysuria, hematuria. ENDOCRINE: Denies nocturia or thyroid problems. HEMATOLOGIC AND LYMPHATIC: Denies easy bruising or bleeding. SKIN: Denies rash or lesion. MUSCULOSKELETAL: Denies pain in neck, back, shoulder, knees, hips or arthritic symptoms.  NEUROLOGIC: Denies paralysis, paresthesias.  PSYCHIATRIC: Denies anxiety or depressive symptoms.   VITAL SIGNS:  Blood pressure (!) 112/54, pulse 75, temperature 97.6 F (36.4 C), temperature source Oral, resp. rate 18, height 5\' 2"  (1.575 m), weight 85.8 kg, SpO2 100 %.  I/O:    Intake/Output Summary (Last 24 hours) at 01/08/2018 1614 Last data filed at 01/08/2018 0417 Gross per 24 hour  Intake 480 ml  Output 3500 ml  Net -3020 ml    PHYSICAL EXAMINATION:  GENERAL:  84 y.o.-year-old patient lying in the bed with no acute distress.  EYES: Pupils equal, round, reactive to light and accommodation. No scleral icterus. Extraocular muscles intact.  HEENT: Head atraumatic, normocephalic. Oropharynx and nasopharynx clear.  NECK:  Supple, no jugular venous distention. No thyroid enlargement, no tenderness.  LUNGS: Moderate breath sounds bilaterally, no wheezing, minimal rales, no rhonchi or crepitation. No use of accessory muscles of respiration.  CARDIOVASCULAR: S1, S2 normal. No murmurs, rubs, or  gallops.  ABDOMEN: Soft, non-tender, non-distended. Bowel sounds present. No organomegaly or mass.  EXTREMITIES: No pedal edema, cyanosis, or clubbing.  NEUROLOGIC: Cranial nerves II through XII are intact. Muscle strength 5/5 in all extremities. Sensation intact. Gait not checked.  PSYCHIATRIC: The patient is alert and oriented x 3.  SKIN: No obvious rash, lesion, or ulcer.   DATA REVIEW:   CBC Recent Labs  Lab 01/06/18 1608  WBC 10.6  HGB 8.9*  HCT 27.5*  PLT 380    Chemistries  Recent Labs  Lab 01/06/18 1608  01/07/18 0858 01/08/18 0355  NA 136   < >  --  140  K 3.9   < >  --  3.6  CL 97*   < >  --  96*  CO2 31   < >  --  36*  GLUCOSE 112*   < >  --  115*  BUN 19   < >  --  15  CREATININE 0.81   < >  --  0.89  CALCIUM 8.4*   < >  --  8.4*  MG  --   --  2.2  --   AST 27  --   --   --   ALT 14  --   --   --   ALKPHOS 67  --   --   --   BILITOT 0.8  --   --   --    < > = values in this interval not displayed.    Cardiac Enzymes Recent Labs  Lab 01/07/18 0858  TROPONINI <0.03    Microbiology Results  Results for orders placed or performed during the hospital encounter of 01/06/18  MRSA PCR Screening     Status: None   Collection Time: 01/07/18  2:17 AM  Result Value Ref Range Status   MRSA by PCR NEGATIVE NEGATIVE Final    Comment:        The GeneXpert MRSA Assay (FDA approved for NASAL specimens only), is one component of a comprehensive MRSA colonization surveillance program. It is not intended to diagnose MRSA infection nor to guide or monitor treatment for MRSA infections. Performed at [facility], [address] [place], [place] [address]     RADIOLOGY:  Dg Chest Portable 1 View  Result Date: 01/06/2018 CLINICAL DATA:  Shortness of breath EXAM: PORTABLE CHEST 1 VIEW COMPARISON:  None. FINDINGS: Cardiomegaly with vascular congestion and mild pulmonary edema. Probable small pleural effusions. Airspace disease at the left base.  Aortic atherosclerosis. No pneumothorax. IMPRESSION: 1. Cardiomegaly with vascular congestion and mild interstitial edema. 2. Probable small pleural effusions with left basilar atelectasis or infiltrate Electronically Signed   By: Jasmine Pang M.D.   On: 01/06/2018 16:25    EKG:   Orders placed or performed in visit on 01/06/18  . EKG 12-Lead  . EKG 12-Lead  . EKG 12-Lead      Management plans discussed with the patient, family and they are in agreement.  CODE STATUS:     Code Status Orders  (From admission, onward)         Start     Ordered   01/06/18 2052  Full code  Continuous     01/06/18 2051        Code Status History    This patient has a current code status but no historical code status.    Advance Directive Documentation     Most Recent Value  Type of Advance Directive  Healthcare Power of Attorney  Pre-existing out of facility DNR order (yellow form or pink MOST form)  Pink MOST form placed in chart (order not valid for inpatient use)  "MOST" Form in Place?  -      TOTAL TIME TAKING CARE OF THIS PATIENT:  43  minutes.   Note: This dictation was prepared with Dragon dictation along with smaller phrase technology. Any transcriptional errors that result from this process are unintentional.   @M[facility] @  on 01/08/2018 at 4:14 PM  Between 7am to 6pm - Pager - [phone]  After 6pm go to [email or web address] - password [email or web address] [facility]  [place] [facility]  Office  [phone]  CC: Primary care physician; Myrene Buddy, NP

## 2018-01-08 NOTE — Care Management (Signed)
[facility].  Spoke with attending and it is thought that patient will be ready for discharge before is accepted  or available bed.

## 2018-01-08 NOTE — Progress Notes (Signed)
[facility]. [facility] and [employer] called for transportation at 1445. Transportation will be provided at 1600 per [employer]. I will continue to assess.

## 2018-01-08 NOTE — Evaluation (Signed)
Physical Therapy Evaluation Patient Details Name: Candice RedderDoris M Kelly MRN: [id number] DOB: 09/30/1925 Today's Date: 01/08/2018   History of Present Illness  Pt admitted for CHF with complaints of SOB symptoms. History includes DM, HTN, L THR, and CVA. [facility]   Clinical Impression  Pt is a pleasant [age] year old female who was admitted for CHF. Pt performs bed mobility with mod assist and transfers with max assist +2 with attempted standing at bedside with RW. Unable to perform ambulation at this time due to severe pain and bleeding noted from B LEs. RN notified and going to address. Pt demonstrates deficits with strength/pain/mobility. Pt on RA for exertion, however due to decreased O2 sats, 1L of O2 returned to pt with sats WNL. Pt is currently not at baseline level. Would benefit from skilled PT to address above deficits and promote optimal return to PLOF; recommend transition to STR upon discharge from acute hospitalization.       Follow Up Recommendations SNF    Equipment Recommendations  (TBD)    Recommendations for Other Services       Precautions / Restrictions Precautions Precautions: Fall Restrictions Weight Bearing Restrictions: No      Mobility  Bed Mobility Overal bed mobility: Needs Assistance Bed Mobility: Supine to Sit     Supine to sit: Mod assist     General bed mobility comments: needs assist and cues for mobility and scooting out towards EOB. Once seated, reports slight dizziness that resolves and pt able to sit with upright posture. O2 removed for mobility however desats to 87%, therefore returned on 1L of O2 with O2 sats WNL.  Transfers Overall transfer level: Needs assistance Equipment used: Rolling walker (2 wheeled) Transfers: Sit to/from Stand Sit to Stand: Max assist;+2 physical assistance         General transfer comment: needs heavy assist and limited by severe pain and bleeding noted from B LEs with WBing. Unable to fully  stand at bedside and returned seated on EOB. Pain subsides greatly with off loading  Ambulation/Gait             General Gait Details: unable to attempt  Stairs            Wheelchair Mobility    Modified Rankin (Stroke Patients Only)       Balance Overall balance assessment: Needs assistance;History of Falls Sitting-balance support: Feet supported;Bilateral upper extremity supported Sitting balance-Leahy Scale: Fair                                       Pertinent Vitals/Pain Pain Assessment: Faces Faces Pain Scale: Hurts whole lot Pain Location: B LE specifically bottom of feet Pain Descriptors / Indicators: Aching;Discomfort Pain Intervention(s): Limited activity within patient's tolerance    Home Living Family/patient expects to be discharged to:: Private residence Living Arrangements: (granddaughter) Available Help at Discharge: Family;Available 24 hours/day Type of Home: House Home Access: Stairs to enter   Entergy CorporationEntrance Stairs-Number of Steps: 1 Home Layout: One level Home Equipment: Walker - 2 wheels      Prior Function Level of Independence: Independent with assistive device(s)         Comments: reports she was able to ambulate short distances with RW, however pt is poor historian, unsure of accurate history or if she required assistance with this task     Hand Dominance  Extremity/Trunk Assessment   Upper Extremity Assessment Upper Extremity Assessment: Generalized weakness(B UE grossly 3+/5 and shoulder ROM limited)    Lower Extremity Assessment Lower Extremity Assessment: Generalized weakness(B LE grossly 3+/5)       Communication   Communication: Expressive difficulties  Cognition Arousal/Alertness: Awake/alert Behavior During Therapy: WFL for tasks assessed/performed Overall Cognitive Status: History of cognitive impairments - at baseline                                        General  Comments      Exercises Other Exercises Other Exercises: supine ther-ex performed on B LE including ankle pumps, SLRs, hip abd/add x 10 reps with cga and safe technique   Assessment/Plan    PT Assessment Patient needs continued PT services  PT Problem List Decreased strength;Decreased balance;Decreased mobility;Pain;Decreased cognition       PT Treatment Interventions Gait training;Therapeutic exercise;Balance training    PT Goals (Current goals can be found in the Care Plan section)  Acute Rehab PT Goals Patient Stated Goal: to go back home PT Goal Formulation: With patient Time For Goal Achievement: 01/22/18 Potential to Achieve Goals: Good    Frequency Min 2X/week   Barriers to discharge        Co-evaluation               AM-PAC PT "6 Clicks" Daily Activity  Outcome Measure Difficulty turning over in bed (including adjusting bedclothes, sheets and blankets)?: Unable Difficulty moving from lying on back to sitting on the side of the bed? : Unable Difficulty sitting down on and standing up from a chair with arms (e.g., wheelchair, bedside commode, etc,.)?: Unable Help needed moving to and from a bed to chair (including a wheelchair)?: A Lot Help needed walking in hospital room?: A Lot Help needed climbing 3-5 steps with a railing? : Total 6 Click Score: 8    End of Session Equipment Utilized During Treatment: Gait belt;Oxygen Activity Tolerance: Patient limited by pain Patient left: in bed;with bed alarm set Nurse Communication: Mobility status PT Visit Diagnosis: Muscle weakness (generalized) (M62.81);Difficulty in walking, not elsewhere classified (R26.2);Pain Pain - Right/Left: (bilat) Pain - part of body: Leg    Time: 0930-1008 PT Time Calculation (min) (ACUTE ONLY): 38 min   Charges:   PT Evaluation $PT Eval Moderate Complexity: 1 Mod PT Treatments $Therapeutic Exercise: 8-22 mins $Therapeutic Activity: 8-22 mins        Elizabeth Palau, PT,  DPT [phone]   Ethlyn Alto 01/08/2018, 11:09 AM

## 2018-01-08 NOTE — Progress Notes (Signed)
[facility]  Patient: Candice RedderDoris M Kelly / Admit Date: 01/06/2018 / Date of Encounter: 01/08/2018, 8:18 AM   Subjective: Breathing is much better at this time.  Patient is having improvements of symptoms after intravenous Lasix.  No evidence of microinfarction with normal troponin.  No rhythm disturbances at this time  Review of Systems: Cannot assess due to expressive aphasia Objective: Telemetry: Normal sinus rhythm Physical Exam: Blood pressure (!) 110/46, pulse 74, temperature 97.7 F (36.5 C), temperature source Oral, resp. rate 18, height 5\' 2"  (1.575 m), weight 85.8 kg, SpO2 94 %. Body mass index is 34.61 kg/m. General: Well developed, well nourished, in no acute distress. Head: Normocephalic, atraumatic, sclera non-icteric, no xanthomas, nares are without discharge. Neck: No apparent masses Lungs: Normal respirations with no wheezes, no rhonchi, no rales , basilar crackles   Heart: Regular rate and rhythm, normal S1 S2, no murmur, no rub, no gallop, PMI is normal size and placement, carotid upstroke normal without bruit, jugular venous pressure normal Abdomen: Soft, non-tender, non-distended with normoactive bowel sounds. No hepatosplenomegaly. Abdominal aorta is normal size without bruit Extremities: Trace edema, no clubbing, no cyanosis, no ulcers,  Peripheral: 2+ radial, 2+ femoral, 2+ dorsal pedal pulses Neuro: Alert and oriented. Moves all extremities spontaneously. Psych:  Responds to questions appropriately with a normal affect.   Intake/Output Summary (Last 24 hours) at 01/08/2018 0818 Last data filed at 01/08/2018 0417 Gross per 24 hour  Intake 843 ml  Output 3500 ml  Net -2657 ml    Inpatient Medications:  . apixaban  5 mg Oral BID  . calcium carbonate  600 mg of elemental calcium Oral Q breakfast  . furosemide  40 mg Intravenous Q12H  . insulin aspart  0-5 Units Subcutaneous QHS  . insulin aspart  0-9 Units Subcutaneous TID WC   . mouth rinse  15 mL Mouth Rinse BID  . multivitamin with minerals  1 tablet Oral Daily  . nystatin  1 g Topical TID  . pantoprazole  40 mg Oral Daily  . PARoxetine  10 mg Oral Daily  . ramipril  2.5 mg Oral Daily  . simvastatin  20 mg Oral QHS  . sodium chloride flush  3 mL Intravenous Q12H   Infusions:  . sodium chloride      Labs: Recent Labs    01/07/18 0252 01/07/18 0858 01/08/18 0355  NA 139  --  140  K 3.1*  --  3.6  CL 98  --  96*  CO2 35*  --  36*  GLUCOSE 140*  --  115*  BUN 19  --  15  CREATININE 0.84  --  0.89  CALCIUM 8.2*  --  8.4*  MG  --  2.2  --    Recent Labs    01/06/18 1140 01/06/18 1608  AST 23 27  ALT 14 14  ALKPHOS 69 67  BILITOT 0.6 0.8  PROT 6.0* 6.6  ALBUMIN 2.6* 2.8*   Recent Labs    01/06/18 1140 01/06/18 1608  WBC 9.9 10.6  NEUTROABS 7.1* 7.6*  HGB 9.0* 8.9*  HCT 28.7* 27.5*  MCV 80.2 79.9*  PLT 380 380   Recent Labs    01/06/18 1608 01/06/18 2246 01/07/18 0252 01/07/18 0858  TROPONINI <0.03 <0.03 <0.03 <0.03   Invalid input(s): POCBNP No results for input(s): HGBA1C in the last 72 hours.   Weights: Filed Weights   01/06/18 2104 01/07/18 0405 01/08/18 0417  Weight: 88 kg 87.5 kg  85.8 kg     Radiology/Studies:  Dg Chest Portable 1 View  Result Date: 01/06/2018 CLINICAL DATA:  Shortness of breath EXAM: PORTABLE CHEST 1 VIEW COMPARISON:  None. FINDINGS: Cardiomegaly with vascular congestion and mild pulmonary edema. Probable small pleural effusions. Airspace disease at the left base. Aortic atherosclerosis. No pneumothorax. IMPRESSION: 1. Cardiomegaly with vascular congestion and mild interstitial edema. 2. Probable small pleural effusions with left basilar atelectasis or infiltrate Electronically Signed   By: Jasmine Pang M.D.   On: 01/06/2018 16:25     Assessment and Recommendation  84 y.o. female with known coronary artery disease previous stroke with expressive aphasia diabetes essential hypertension mixed  hyperlipidemia with acute on chronic systolic dysfunction congestive heart failure significantly improved without evidence of myocardial infarction 1.  Continue Lasix for further risk reduction in treatment of acute on chronic systolic dysfunction heart failure 2.  Continue following closely for atrial fibrillation 3.  Anticoagulation for further risk reduction of stroke with atrial fibrillation and previous stroke 4.  High intensity cholesterol therapy 5.  No further cardiac diagnostics necessary at this time 6.  Begin ambulation and follow for improvements in discharge when clinically improved with follow-up next week  Signed, Arnoldo Hooker M.D. FACC

## 2018-01-08 NOTE — NC FL2 (Signed)
[place] MEDICAID FL2 LEVEL OF CARE SCREENING TOOL     IDENTIFICATION  Patient Name: Candice Kelly Birthdate: Feb 10, 1926 Sex: female Admission Date (Current Location): 01/06/2018  [place] Number:  Chiropodist and Address:  [facility], [address] [place], [place] [address]      Provider Number: [id number]  Attending Physician Name and Address:  Ramonita Lab, MD  Relative Name and Phone Number:  [facility] Granddaughter [phone]     Current Level of Care: Hospital Recommended Level of Care: Skilled Nursing Facility Prior Approval Number:    Date Approved/Denied:   PASRR Number: [id number]  Discharge Plan: SNF    Current Diagnoses: Patient Active Problem List   Diagnosis Date Noted  . Pressure injury of skin 01/07/2018  . CHF (congestive heart failure) (HCC) 01/06/2018  . Stroke (cerebrum) (HCC) 04/15/2017  . Diabetes mellitus without complication (HCC) 04/15/2017  . Swelling of limb 04/15/2017    Orientation RESPIRATION BLADDER Height & Weight     Self, Situation  O2(1L) Continent Weight: 189 lb 3.2 oz (85.8 kg) Height:  5\' 2"  (157.5 cm)  BEHAVIORAL SYMPTOMS/MOOD NEUROLOGICAL BOWEL NUTRITION STATUS      Continent Diet(Cardiac diet)  AMBULATORY STATUS COMMUNICATION OF NEEDS Skin   Limited Assist Verbally PU Stage and Appropriate Care PU Stage 1 Dressing: (PRN dressing change)                     Personal Care Assistance Level of Assistance  Bathing, Feeding, Dressing Bathing Assistance: Limited assistance Feeding assistance: Independent Dressing Assistance: Limited assistance     Functional Limitations Info  Sight, Hearing, Speech Sight Info: Adequate Hearing Info: Adequate Speech Info: Adequate    SPECIAL CARE FACTORS FREQUENCY  PT (By licensed PT), OT (By licensed OT)     PT Frequency: 5x a week OT Frequency: 5x a week            Contractures Contractures Info: Not present     Additional Factors Info  Code Status, Allergies, Insulin Sliding Scale, Psychotropic Code Status Info: Full Code Allergies Info: POTASSIUM, PENICILLIN V POTASSIUM, PENICILLINS  Psychotropic Info: PARoxetine (PAXIL) tablet 10 mg  Insulin Sliding Scale Info: insulin aspart (novoLOG) injection 0-9 Units 3x a day with meals       Current Medications (01/08/2018):  This is the current hospital active medication list Current Facility-Administered Medications  Medication Dose Route Frequency Provider Last Rate Last Dose  . 0.9 %  sodium chloride infusion  250 mL Intravenous PRN Pyreddy, Vivien Rota, MD      . acetaminophen (TYLENOL) tablet 650 mg  650 mg Oral Q4H PRN Pyreddy, Vivien Rota, MD      . ALPRAZolam Prudy Feeler) tablet 0.25 mg  0.25 mg Oral TID PRN Ihor Austin, MD      . apixaban (ELIQUIS) tablet 5 mg  5 mg Oral BID Ihor Austin, MD   5 mg at 01/08/18 0919  . calcium carbonate (TUMS - dosed in mg elemental calcium) chewable tablet 600 mg of elemental calcium  600 mg of elemental calcium Oral Q breakfast Malena Peer D, RPH   600 mg of elemental calcium at 01/08/18 0919  . furosemide (LASIX) injection 40 mg  40 mg Intravenous Q12H Ihor Austin, MD   40 mg at 01/08/18 0920  . insulin aspart (novoLOG) injection 0-5 Units  0-5 Units Subcutaneous QHS Pyreddy, Pavan, MD      . insulin aspart (novoLOG) injection 0-9 Units  0-9 Units Subcutaneous TID  WC Ihor AustinPyreddy, Pavan, MD   2 Units at 01/07/18 1152  . MEDLINE mouth rinse  15 mL Mouth Rinse BID Pyreddy, Vivien RotaPavan, MD   15 mL at 01/08/18 0930  . multivitamin with minerals tablet 1 tablet  1 tablet Oral Daily Ihor AustinPyreddy, Pavan, MD   1 tablet at 01/08/18 0920  . nystatin (MYCOSTATIN/NYSTOP) topical powder 1 g  1 g Topical TID Ihor AustinPyreddy, Pavan, MD   1 g at 01/08/18 0934  . ondansetron (ZOFRAN) injection 4 mg  4 mg Intravenous Q6H PRN Pyreddy, Pavan, MD      . oxyCODONE (Oxy IR/ROXICODONE) immediate release tablet 2.5 mg  2.5 mg Oral Q6H PRN Pyreddy, Pavan, MD       . pantoprazole (PROTONIX) EC tablet 40 mg  40 mg Oral Daily Pyreddy, Vivien RotaPavan, MD   40 mg at 01/08/18 0919  . PARoxetine (PAXIL) tablet 10 mg  10 mg Oral Daily Pyreddy, Vivien RotaPavan, MD   10 mg at 01/08/18 0920  . ramipril (ALTACE) capsule 2.5 mg  2.5 mg Oral Daily Pyreddy, Pavan, MD   2.5 mg at 01/08/18 0921  . simvastatin (ZOCOR) tablet 20 mg  20 mg Oral QHS Ihor AustinPyreddy, Pavan, MD   20 mg at 01/07/18 2151  . sodium chloride flush (NS) 0.9 % injection 3 mL  3 mL Intravenous Q12H Ihor AustinPyreddy, Pavan, MD   3 mL at 01/08/18 0933  . sodium chloride flush (NS) 0.9 % injection 3 mL  3 mL Intravenous PRN Ihor AustinPyreddy, Pavan, MD         Discharge Medications: Please see discharge summary for a list of discharge medications.  Relevant Imaging Results:  Relevant Lab Results:   Additional Information SSN [id number]  Darleene Cleavernterhaus, Jenesys Casseus R, [place]WA

## 2018-01-25 NOTE — Progress Notes (Deleted)
   Patient ID: Candice Kelly, female    DOB: March 22, 1926, 84 y.o.   MRN: [id number]  HPI  Candice Kelly is a 84 y/o female with a history of  Echo report from 01/07/18 reviewed and showed an EF of 50-55% along with moderate MR and a PA pressure of 44 mm Hg.   Admitted 01/06/18 due to acute heart failure. Cardiology consult obtained. Initially needed IV lasix. Transferred to [facility] after 2 days due to patient's primary cardiology care being at [facility].   Review of Systems    Physical Exam  Assessment & Plan:  1: Chronic heart failure with preserved ejection fraction- - NYHA class - BMP 01/06/18 reviewed and showed sodium 140, potassium 3.6, creatinine 0.89 and GFR 55 - BNP 01/06/18 was 343.0  2: DM-

## 2018-01-26 ENCOUNTER — Telehealth: Payer: Self-pay | Admitting: Family

## 2018-01-26 ENCOUNTER — Ambulatory Visit: Payer: Medicare Other | Admitting: Family

## 2018-01-26 NOTE — Telephone Encounter (Signed)
Patient did not show for her Heart Failure Clinic appointment on 01/26/18. Will attempt to reschedule.  

## 2019-03-30 DEATH — deceased

## 2019-11-15 IMAGING — US US EXTREM LOW VENOUS BILAT
1 series · 13 of 24 positions shown · non-contrast
Comparison: None.

CLINICAL DATA: Bilateral lower extremity swelling



[Series 1: us extrem low venous bilat · 0.07mm/px · 13 of 56 slices shown]
[im 1/56]
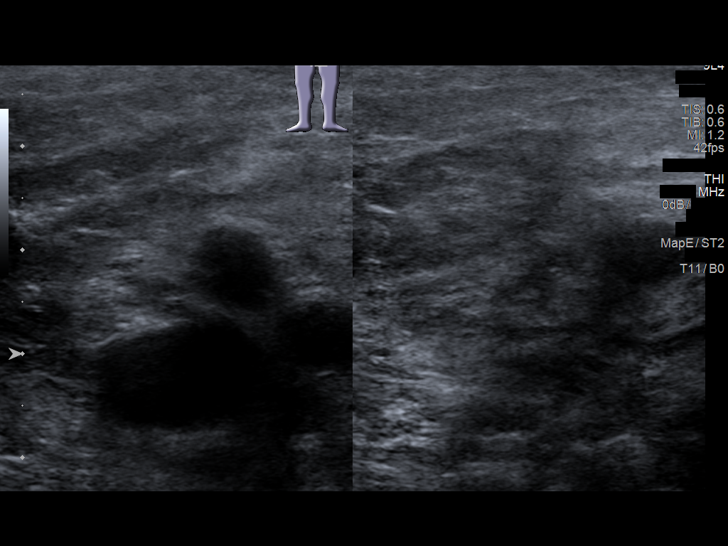
[im 5/56]
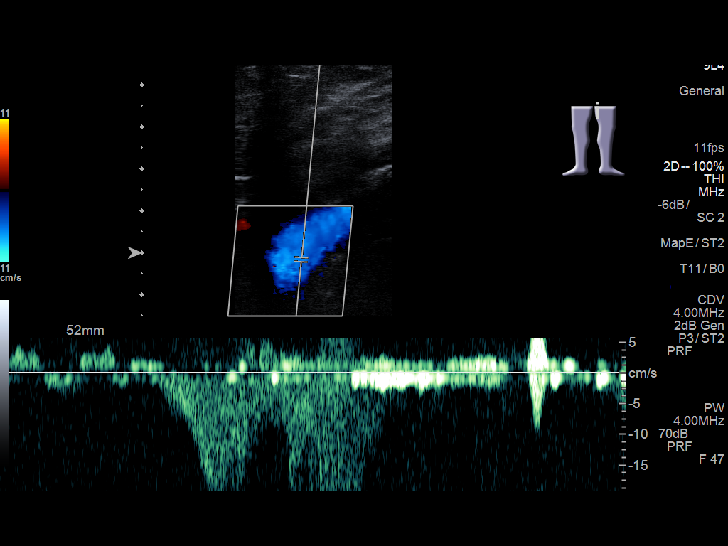
[im 10/56]
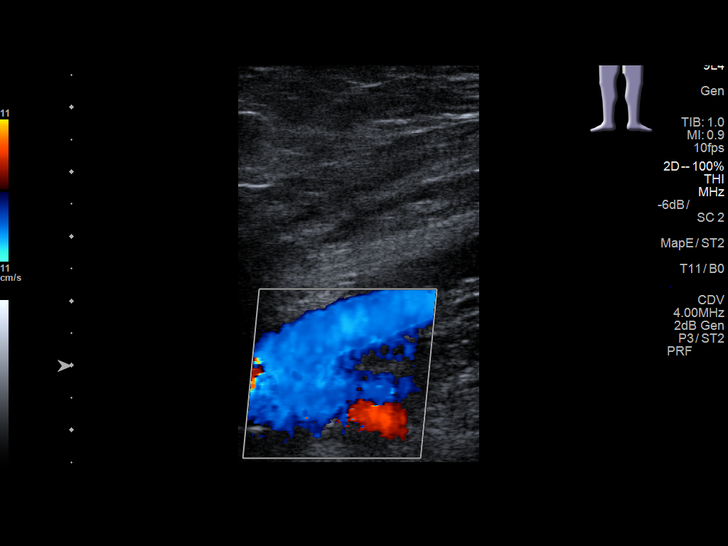
[im 15/56]
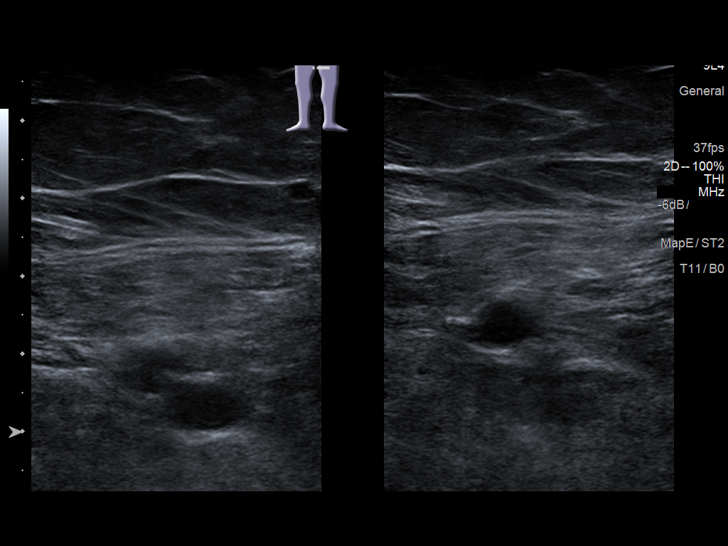
[im 20/56]
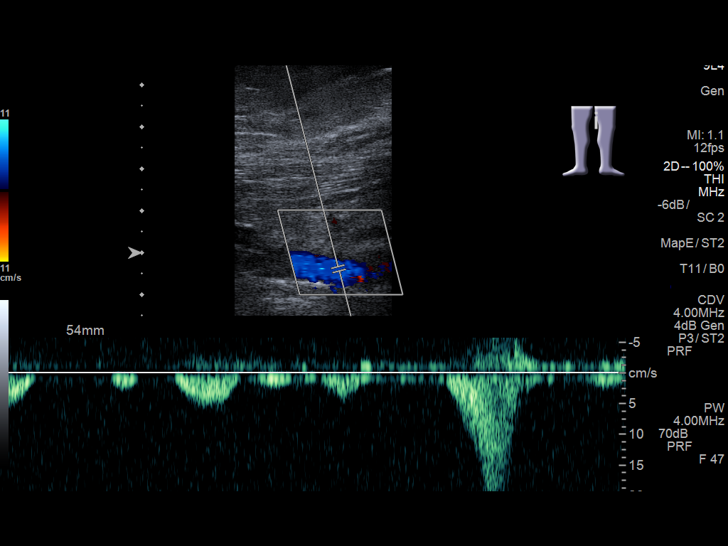
[im 24/56]
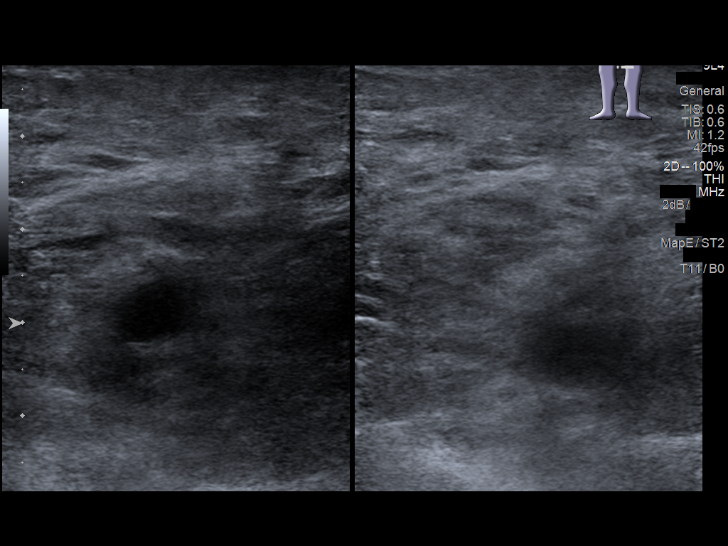
[im 29/56]
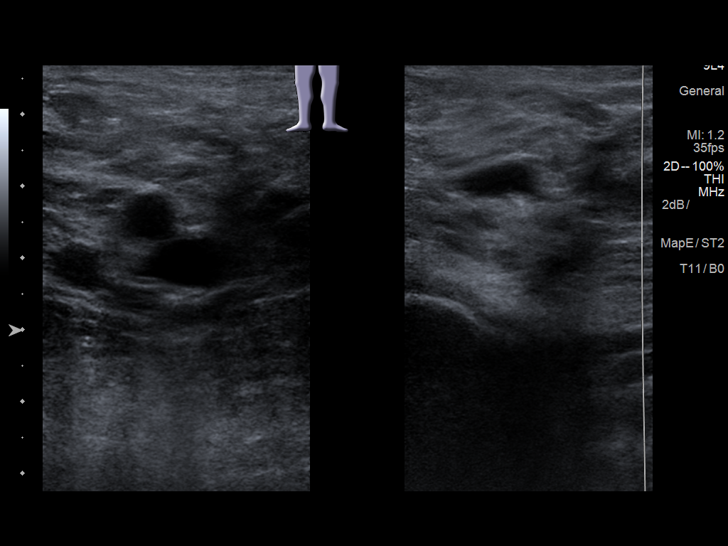
[im 32/56]
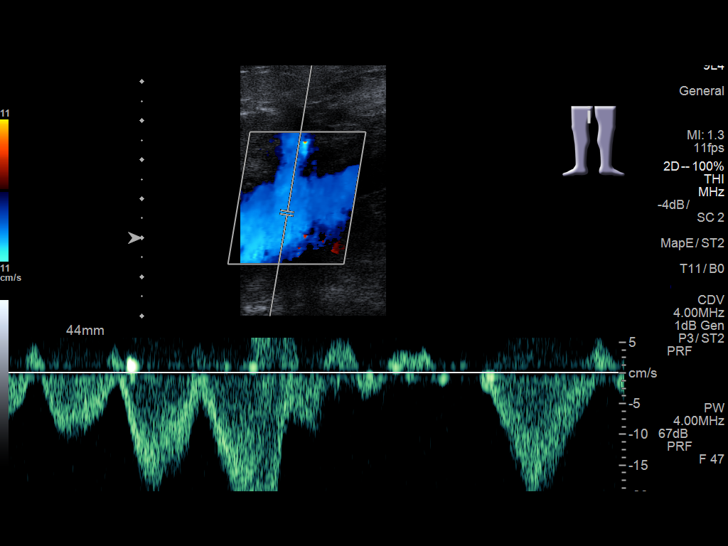
[im 36/56]
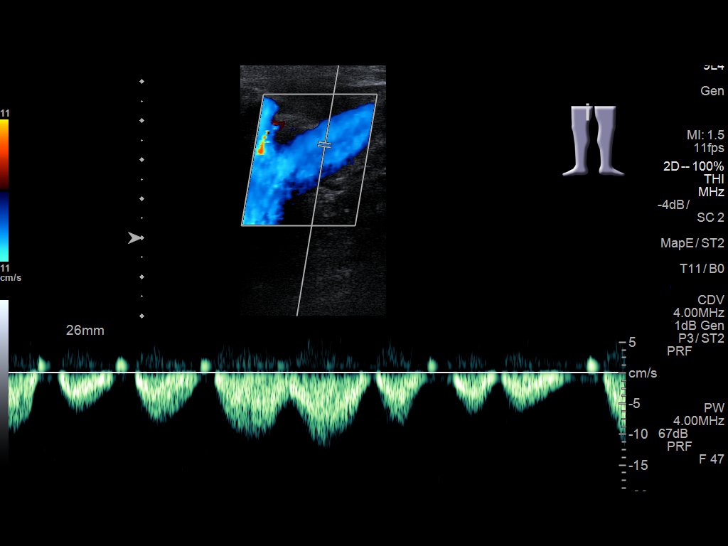
[im 41/56]
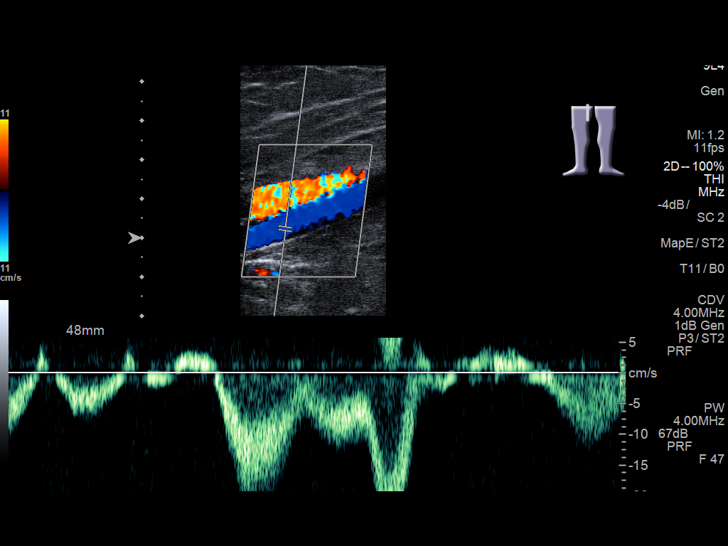
[im 46/56]
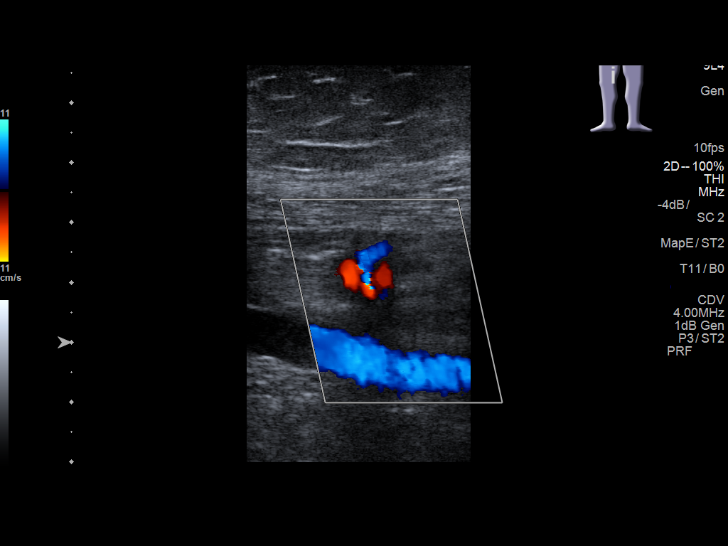
[im 51/56]
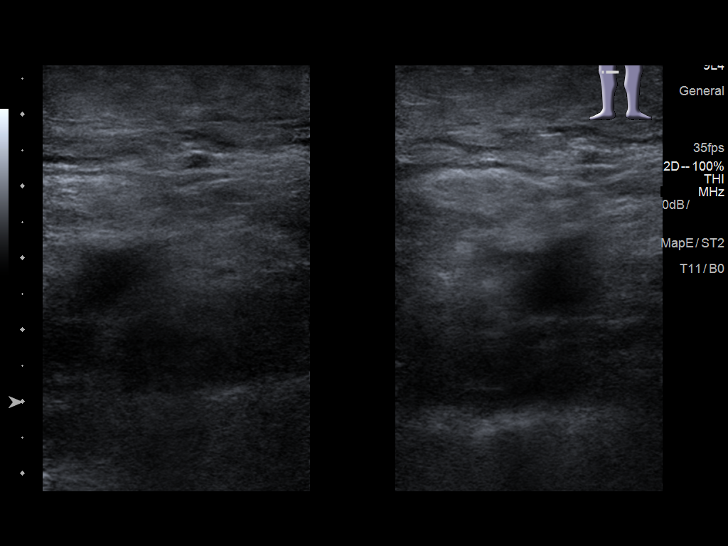
[im 56/56]
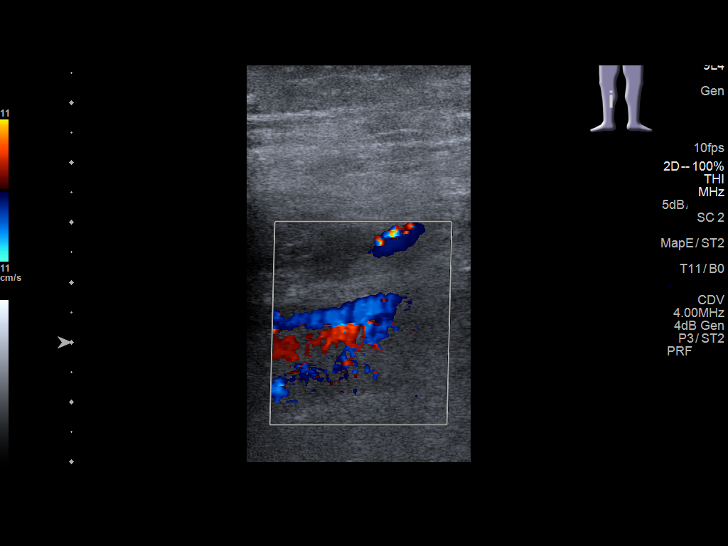

[13 of 24 positions shown; findings below may reference images not displayed]

FINDINGS: RIGHT LOWER EXTREMITY

Common Femoral Vein: No evidence of thrombus. Normal
compressibility, respiratory phasicity and response to augmentation.

Saphenofemoral Junction: No evidence of thrombus. Normal
compressibility and flow on color Doppler imaging.

Profunda Femoral Vein: No evidence of thrombus. Normal
compressibility and flow on color Doppler imaging.

Femoral Vein: No evidence of thrombus. Normal compressibility,
respiratory phasicity and response to augmentation.

Popliteal Vein: No evidence of thrombus. Normal compressibility,
respiratory phasicity and response to augmentation.

Calf Veins: No evidence of thrombus. Normal compressibility and flow
on color Doppler imaging.

Superficial Great Saphenous Vein: No evidence of thrombus. Normal
compressibility.

Venous Reflux:  None.

Other Findings:  None.

LEFT LOWER EXTREMITY

Common Femoral Vein: No evidence of thrombus. Normal
compressibility, respiratory phasicity and response to augmentation.

Saphenofemoral Junction: No evidence of thrombus. Normal
compressibility and flow on color Doppler imaging.

Profunda Femoral Vein: No evidence of thrombus. Normal
compressibility and flow on color Doppler imaging.

Femoral Vein: No evidence of thrombus. Normal compressibility,
respiratory phasicity and response to augmentation.

Popliteal Vein: No evidence of thrombus. Normal compressibility,
respiratory phasicity and response to augmentation.

Calf Veins: No evidence of thrombus. Normal compressibility and flow
on color Doppler imaging.

Superficial Great Saphenous Vein: No evidence of thrombus. Normal
compressibility.

Venous Reflux:  None.

Other Findings:  None.
IMPRESSION: No evidence of deep venous thrombosis.
